# Patient Record
Sex: Female | Born: 1980 | Race: White | Hispanic: No | Marital: Single | Smoking: Former smoker
Health system: Southern US, Community
[De-identification: ages and names within clinical notes are randomized; demographics above are authoritative.]

## PROBLEM LIST (undated history)

## (undated) DIAGNOSIS — F32A Depression, unspecified: Secondary | ICD-10-CM

## (undated) DIAGNOSIS — K069 Disorder of gingiva and edentulous alveolar ridge, unspecified: Secondary | ICD-10-CM

## (undated) DIAGNOSIS — D649 Anemia, unspecified: Secondary | ICD-10-CM

## (undated) DIAGNOSIS — F909 Attention-deficit hyperactivity disorder, unspecified type: Secondary | ICD-10-CM

## (undated) DIAGNOSIS — F419 Anxiety disorder, unspecified: Secondary | ICD-10-CM

## (undated) DIAGNOSIS — G43909 Migraine, unspecified, not intractable, without status migrainosus: Secondary | ICD-10-CM

## (undated) DIAGNOSIS — R109 Unspecified abdominal pain: Secondary | ICD-10-CM

## (undated) DIAGNOSIS — F329 Major depressive disorder, single episode, unspecified: Secondary | ICD-10-CM

## (undated) DIAGNOSIS — K8020 Calculus of gallbladder without cholecystitis without obstruction: Secondary | ICD-10-CM

## (undated) HISTORY — DX: Unspecified abdominal pain: R10.9

## (undated) HISTORY — DX: Anemia, unspecified: D64.9

## (undated) HISTORY — DX: Disorder of gingiva and edentulous alveolar ridge, unspecified: K06.9

## (undated) HISTORY — DX: Depression, unspecified: F32.A

## (undated) HISTORY — DX: Major depressive disorder, single episode, unspecified: F32.9

## (undated) HISTORY — DX: Attention-deficit hyperactivity disorder, unspecified type: F90.9

## (undated) HISTORY — DX: Calculus of gallbladder without cholecystitis without obstruction: K80.20

## (undated) HISTORY — DX: Anxiety disorder, unspecified: F41.9

## (undated) HISTORY — DX: Migraine, unspecified, not intractable, without status migrainosus: G43.909

---

## 1999-05-20 ENCOUNTER — Encounter: Payer: Self-pay | Admitting: *Deleted

## 1999-05-20 ENCOUNTER — Ambulatory Visit (HOSPITAL_COMMUNITY): Admission: RE | Admit: 1999-05-20 | Discharge: 1999-05-20 | Payer: Self-pay | Admitting: *Deleted

## 1999-07-25 ENCOUNTER — Inpatient Hospital Stay (HOSPITAL_COMMUNITY): Admission: AD | Admit: 1999-07-25 | Discharge: 1999-07-25 | Payer: Self-pay | Admitting: *Deleted

## 1999-09-25 ENCOUNTER — Inpatient Hospital Stay (HOSPITAL_COMMUNITY): Admission: AD | Admit: 1999-09-25 | Discharge: 1999-09-29 | Payer: Self-pay | Admitting: *Deleted

## 1999-09-25 ENCOUNTER — Encounter (INDEPENDENT_AMBULATORY_CARE_PROVIDER_SITE_OTHER): Payer: Self-pay

## 1999-12-23 ENCOUNTER — Inpatient Hospital Stay (HOSPITAL_COMMUNITY): Admission: AD | Admit: 1999-12-23 | Discharge: 1999-12-23 | Payer: Self-pay | Admitting: *Deleted

## 2000-03-16 ENCOUNTER — Inpatient Hospital Stay (HOSPITAL_COMMUNITY): Admission: AD | Admit: 2000-03-16 | Discharge: 2000-03-16 | Payer: Self-pay | Admitting: *Deleted

## 2000-06-09 ENCOUNTER — Inpatient Hospital Stay (HOSPITAL_COMMUNITY): Admission: AD | Admit: 2000-06-09 | Discharge: 2000-06-09 | Payer: Self-pay | Admitting: *Deleted

## 2000-07-07 ENCOUNTER — Emergency Department (HOSPITAL_COMMUNITY): Admission: EM | Admit: 2000-07-07 | Discharge: 2000-07-07 | Payer: Self-pay | Admitting: Emergency Medicine

## 2000-07-07 ENCOUNTER — Encounter: Payer: Self-pay | Admitting: Emergency Medicine

## 2000-09-02 ENCOUNTER — Inpatient Hospital Stay (HOSPITAL_COMMUNITY): Admission: AD | Admit: 2000-09-02 | Discharge: 2000-09-02 | Payer: Self-pay | Admitting: *Deleted

## 2000-11-04 ENCOUNTER — Emergency Department (HOSPITAL_COMMUNITY): Admission: EM | Admit: 2000-11-04 | Discharge: 2000-11-04 | Payer: Self-pay | Admitting: Emergency Medicine

## 2000-11-05 ENCOUNTER — Emergency Department (HOSPITAL_COMMUNITY): Admission: EM | Admit: 2000-11-05 | Discharge: 2000-11-06 | Payer: Self-pay | Admitting: Emergency Medicine

## 2000-11-06 ENCOUNTER — Encounter: Payer: Self-pay | Admitting: Emergency Medicine

## 2000-11-15 ENCOUNTER — Emergency Department (HOSPITAL_COMMUNITY): Admission: EM | Admit: 2000-11-15 | Discharge: 2000-11-15 | Payer: Self-pay | Admitting: Emergency Medicine

## 2001-01-15 ENCOUNTER — Emergency Department (HOSPITAL_COMMUNITY): Admission: EM | Admit: 2001-01-15 | Discharge: 2001-01-15 | Payer: Self-pay | Admitting: Emergency Medicine

## 2001-01-15 ENCOUNTER — Encounter: Payer: Self-pay | Admitting: Emergency Medicine

## 2001-01-19 ENCOUNTER — Emergency Department (HOSPITAL_COMMUNITY): Admission: EM | Admit: 2001-01-19 | Discharge: 2001-01-19 | Payer: Self-pay

## 2001-03-09 ENCOUNTER — Other Ambulatory Visit: Admission: RE | Admit: 2001-03-09 | Discharge: 2001-03-09 | Payer: Self-pay | Admitting: Family Medicine

## 2001-05-12 ENCOUNTER — Emergency Department (HOSPITAL_COMMUNITY): Admission: EM | Admit: 2001-05-12 | Discharge: 2001-05-12 | Payer: Self-pay | Admitting: Emergency Medicine

## 2001-06-03 ENCOUNTER — Inpatient Hospital Stay (HOSPITAL_COMMUNITY): Admission: EM | Admit: 2001-06-03 | Discharge: 2001-06-10 | Payer: Self-pay | Admitting: Psychiatry

## 2001-06-15 ENCOUNTER — Encounter: Admission: RE | Admit: 2001-06-15 | Discharge: 2001-06-15 | Payer: Self-pay | Admitting: *Deleted

## 2001-06-26 ENCOUNTER — Inpatient Hospital Stay (HOSPITAL_COMMUNITY): Admission: EM | Admit: 2001-06-26 | Discharge: 2001-07-06 | Payer: Self-pay | Admitting: Psychiatry

## 2001-07-21 ENCOUNTER — Inpatient Hospital Stay (HOSPITAL_COMMUNITY): Admission: EM | Admit: 2001-07-21 | Discharge: 2001-07-23 | Payer: Self-pay

## 2001-07-23 ENCOUNTER — Inpatient Hospital Stay (HOSPITAL_COMMUNITY): Admission: AD | Admit: 2001-07-23 | Discharge: 2001-07-26 | Payer: Self-pay | Admitting: Psychiatry

## 2001-08-23 ENCOUNTER — Encounter: Admission: RE | Admit: 2001-08-23 | Discharge: 2001-08-23 | Payer: Self-pay | Admitting: *Deleted

## 2003-01-22 ENCOUNTER — Encounter: Admission: RE | Admit: 2003-01-22 | Discharge: 2003-01-22 | Payer: Self-pay | Admitting: Internal Medicine

## 2003-06-04 ENCOUNTER — Encounter: Admission: RE | Admit: 2003-06-04 | Discharge: 2003-06-04 | Payer: Self-pay | Admitting: Internal Medicine

## 2003-07-16 ENCOUNTER — Inpatient Hospital Stay (HOSPITAL_COMMUNITY): Admission: AD | Admit: 2003-07-16 | Discharge: 2003-07-16 | Payer: Self-pay | Admitting: Obstetrics & Gynecology

## 2003-08-13 ENCOUNTER — Other Ambulatory Visit: Admission: RE | Admit: 2003-08-13 | Discharge: 2003-08-13 | Payer: Self-pay | Admitting: Obstetrics and Gynecology

## 2004-02-22 ENCOUNTER — Encounter (INDEPENDENT_AMBULATORY_CARE_PROVIDER_SITE_OTHER): Payer: Self-pay | Admitting: *Deleted

## 2004-02-22 ENCOUNTER — Inpatient Hospital Stay (HOSPITAL_COMMUNITY): Admission: RE | Admit: 2004-02-22 | Discharge: 2004-02-26 | Payer: Self-pay | Admitting: Obstetrics and Gynecology

## 2005-07-04 ENCOUNTER — Emergency Department (HOSPITAL_COMMUNITY): Admission: EM | Admit: 2005-07-04 | Discharge: 2005-07-05 | Payer: Self-pay | Admitting: Emergency Medicine

## 2005-07-04 ENCOUNTER — Emergency Department (HOSPITAL_COMMUNITY): Admission: EM | Admit: 2005-07-04 | Discharge: 2005-07-04 | Payer: Self-pay | Admitting: Emergency Medicine

## 2005-11-20 ENCOUNTER — Emergency Department (HOSPITAL_COMMUNITY): Admission: EM | Admit: 2005-11-20 | Discharge: 2005-11-20 | Payer: Self-pay | Admitting: Emergency Medicine

## 2006-06-09 ENCOUNTER — Emergency Department (HOSPITAL_COMMUNITY): Admission: EM | Admit: 2006-06-09 | Discharge: 2006-06-09 | Payer: Self-pay | Admitting: Emergency Medicine

## 2006-06-30 ENCOUNTER — Emergency Department (HOSPITAL_COMMUNITY): Admission: EM | Admit: 2006-06-30 | Discharge: 2006-06-30 | Payer: Self-pay | Admitting: Emergency Medicine

## 2006-07-01 ENCOUNTER — Emergency Department (HOSPITAL_COMMUNITY): Admission: EM | Admit: 2006-07-01 | Discharge: 2006-07-01 | Payer: Self-pay | Admitting: Emergency Medicine

## 2007-03-15 ENCOUNTER — Emergency Department (HOSPITAL_COMMUNITY): Admission: EM | Admit: 2007-03-15 | Discharge: 2007-03-15 | Payer: Self-pay | Admitting: Emergency Medicine

## 2008-09-03 ENCOUNTER — Emergency Department (HOSPITAL_COMMUNITY): Admission: EM | Admit: 2008-09-03 | Discharge: 2008-09-03 | Payer: Self-pay | Admitting: Family Medicine

## 2009-03-04 ENCOUNTER — Emergency Department (HOSPITAL_COMMUNITY): Admission: EM | Admit: 2009-03-04 | Discharge: 2009-03-04 | Payer: Self-pay | Admitting: Emergency Medicine

## 2010-02-25 ENCOUNTER — Ambulatory Visit: Payer: Self-pay | Admitting: Family Medicine

## 2010-02-25 DIAGNOSIS — F329 Major depressive disorder, single episode, unspecified: Secondary | ICD-10-CM | POA: Insufficient documentation

## 2010-02-25 DIAGNOSIS — D509 Iron deficiency anemia, unspecified: Secondary | ICD-10-CM

## 2010-02-26 LAB — CONVERTED CEMR LAB
CO2: 29 meq/L (ref 19–32)
Cholesterol: 177 mg/dL (ref 0–200)
Eosinophils Absolute: 0.2 10*3/uL (ref 0.0–0.7)
Eosinophils Relative: 2.4 % (ref 0.0–5.0)
GFR calc non Af Amer: 97.09 mL/min (ref >60)
Glucose, Bld: 90 mg/dL (ref 70–99)
LDL Cholesterol: 120 mg/dL — ABNORMAL HIGH (ref 0–99)
Lymphocytes Relative: 25.1 % (ref 12.0–46.0)
MCV: 74.5 fL — ABNORMAL LOW (ref 78.0–100.0)
Monocytes Absolute: 0.4 10*3/uL (ref 0.1–1.0)
Monocytes Relative: 5.4 % (ref 3.0–12.0)
Neutro Abs: 5.5 10*3/uL (ref 1.4–7.7)
Neutrophils Relative %: 66.9 % (ref 43.0–77.0)
Platelets: 377 10*3/uL (ref 150.0–400.0)
Potassium: 5.1 meq/L (ref 3.5–5.1)
Sodium: 140 meq/L (ref 135–145)
Total CHOL/HDL Ratio: 5
VLDL: 19.4 mg/dL (ref 0.0–40.0)
WBC: 8.2 10*3/uL (ref 4.5–10.5)

## 2010-03-28 ENCOUNTER — Ambulatory Visit: Payer: Self-pay | Admitting: Family Medicine

## 2010-03-28 DIAGNOSIS — H109 Unspecified conjunctivitis: Secondary | ICD-10-CM | POA: Insufficient documentation

## 2010-03-28 DIAGNOSIS — F411 Generalized anxiety disorder: Secondary | ICD-10-CM

## 2010-04-04 ENCOUNTER — Ambulatory Visit: Payer: Self-pay | Admitting: Internal Medicine

## 2010-04-04 LAB — CONVERTED CEMR LAB: Rapid Strep: NEGATIVE

## 2010-04-16 ENCOUNTER — Ambulatory Visit: Payer: Self-pay | Admitting: Family Medicine

## 2010-05-15 ENCOUNTER — Telehealth: Payer: Self-pay | Admitting: Family Medicine

## 2010-05-27 NOTE — Assessment & Plan Note (Signed)
Summary: BRAND NEW PT/TO EST/PT REQ CPX/COMING IN FASTING/PER DR Antoneo Ghrist/CJR   Vital Signs:  Patient profile:   30 year old female Height:      67.5 inches Weight:      206 pounds BMI:     31.90 O2 Sat:      98 % Temp:     98.5 degrees F Pulse rate:   103 / minute BP sitting:   120 / 82  (left arm) Cuff size:   large  Vitals Entered By: Pura Spice, RN (February 25, 2010 8:31 AM) CC: new to est. wants cpx fasting. under alot stress. parents nephews now living with her and her family PAP sch for Mar 10 2010 w/ Dr Jackelyn Knife    History of Present Illness: 30 yr old female to establish with [place] and for a cpx. She feels fine physically but is under a lot of stress right now. Her parents had to leave their apartment 3 weeks ago because they could not keep up with their rent payments, so they moved in with the patient. Now her house is full of people, and she is very stressed out. She can't relax, she is tearful, she has little patience with everyone, etc. She took Zoloft and Xanax for awhile about 10 years ago when she dealt with depression, and they helped a lot.   Preventive Screening-Counseling & Management  Alcohol-Tobacco     Smoking Status: current     Smoking Cessation Counseling: YES     Packs/Day: 0.5     Year Started: 1995  Allergies (verified): 1)  ! Pcn 2)  ! Sulfa  Past History:  Past Medical History: Anemia-iron deficiency Depression migraine Headaches treated for ADHD as a child sees Dr. Jarold Song for GYN exams  Past Surgical History: Caesarean sections times 2  Past History:  Care Management: Gynecology: Dr Jackelyn Knife   Family History: Reviewed history and no changes required. Family History Breast cancer 1st degree relative <50 Family History Depression  Social History: Reviewed history and no changes required. Occupation: Careers adviser at [employer] Current Smoker 1/2 ppd Alcohol use-yes engaged, lives with her fiance  Smoking Status:   current Packs/Day:  0.5 Occupation:  employed  Review of Systems  The patient denies anorexia, fever, weight loss, weight gain, vision loss, decreased hearing, hoarseness, chest pain, syncope, dyspnea on exertion, peripheral edema, prolonged cough, headaches, hemoptysis, abdominal pain, melena, hematochezia, severe indigestion/heartburn, hematuria, incontinence, genital sores, muscle weakness, suspicious skin lesions, transient blindness, difficulty walking, unusual weight change, abnormal bleeding, enlarged lymph nodes, angioedema, breast masses, and testicular masses.    Physical Exam  General:  overweight-appearing.   Head:  Normocephalic and atraumatic without obvious abnormalities. No apparent alopecia or balding. Eyes:  No corneal or conjunctival inflammation noted. EOMI. Perrla. Funduscopic exam benign, without hemorrhages, exudates or papilledema. Vision grossly normal. Ears:  External ear exam shows no significant lesions or deformities.  Otoscopic examination reveals clear canals, tympanic membranes are intact bilaterally without bulging, retraction, inflammation or discharge. Hearing is grossly normal bilaterally. Nose:  External nasal examination shows no deformity or inflammation. Nasal mucosa are pink and moist without lesions or exudates. Mouth:  Oral mucosa and oropharynx without lesions or exudates.  Teeth in good repair. Neck:  No deformities, masses, or tenderness noted. Chest Wall:  No deformities, masses, or tenderness noted. Lungs:  Normal respiratory effort, chest expands symmetrically. Lungs are clear to auscultation, no crackles or wheezes. Heart:  Normal rate and regular rhythm. S1 and S2  normal without gallop, murmur, click, rub or other extra sounds. Abdomen:  Bowel sounds positive,abdomen soft and non-tender without masses, organomegaly or hernias noted. Msk:  No deformity or scoliosis noted of thoracic or lumbar spine.   Pulses:  R and L  carotid,radial,femoral,dorsalis pedis and posterior tibial pulses are full and equal bilaterally Extremities:  No clubbing, cyanosis, edema, or deformity noted with normal full range of motion of all joints.   Neurologic:  No cranial nerve deficits noted. Station and gait are normal. Plantar reflexes are down-going bilaterally. DTRs are symmetrical throughout. Sensory, motor and coordinative functions appear intact. Skin:  Intact without suspicious lesions or rashes Cervical Nodes:  No lymphadenopathy noted Axillary Nodes:  No palpable lymphadenopathy Inguinal Nodes:  No significant adenopathy Psych:  Cognition and judgment appear intact. Alert and cooperative with normal attention span and concentration. No apparent delusions, illusions, hallucinations   Impression & Recommendations:  Problem # 1:  HEALTH MAINTENANCE EXAM (ICD-V70.0)  Orders: UA Dipstick w/o Micro (automated)  (81003) Venipuncture (83151) TLB-Lipid Panel (80061-LIPID) TLB-BMP (Basic Metabolic Panel-BMET) (80048-METABOL) TLB-CBC Platelet - w/Differential (85025-CBCD) TLB-Hepatic/Liver Function Pnl (80076-HEPATIC) TLB-TSH (Thyroid Stimulating Hormone) (84443-TSH)  Problem # 2:  DEPRESSION (ICD-311)  Her updated medication list for this problem includes:    Celexa 20 Mg Tabs (Citalopram hydrobromide) ..... Once daily    Alprazolam 0.5 Mg Tabs (Alprazolam) .Marland Kitchen..Marland Kitchen Two times a day as needed anxiety  Complete Medication List: 1)  Celexa 20 Mg Tabs (Citalopram hydrobromide) .... Once daily 2)  Alprazolam 0.5 Mg Tabs (Alprazolam) .... Two times a day as needed anxiety  Patient Instructions: 1)  Tobacco is very bad for your health and your loved ones ! You should stop smoking !  2)  It is important that you exercise reguarly at least 20 minutes 5 times a week. If you develop chest pain, have severe difficulty breathing, or feel very tired, stop exercising immediately and seek medical attention.  3)  You need to lose weight.  Consider a lower calorie diet and regular exercise.  4)  get fasting labs today 5)  Please schedule a follow-up appointment in 1 month.  Prescriptions: ALPRAZOLAM 0.5 MG TABS (ALPRAZOLAM) two times a day as needed anxiety  #60 x 2   Entered and Authorized by:   Nelwyn Salisbury MD   Signed by:   Nelwyn Salisbury MD on 02/25/2010   Method used:   Print then Give to Patient   RxID:   [id number] CELEXA 20 MG TABS (CITALOPRAM HYDROBROMIDE) once daily  #30 x 2   Entered and Authorized by:   Nelwyn Salisbury MD   Signed by:   Nelwyn Salisbury MD on 02/25/2010   Method used:   Print then Give to Patient   RxID:   [id number]    Orders Added: 1)  New Patient 18-39 years [99385] 2)  UA Dipstick w/o Micro (automated)  [81003] 3)  Venipuncture [36415] 4)  TLB-Lipid Panel [80061-LIPID] 5)  TLB-BMP (Basic Metabolic Panel-BMET) [80048-METABOL] 6)  TLB-CBC Platelet - w/Differential [85025-CBCD] 7)  TLB-Hepatic/Liver Function Pnl [80076-HEPATIC] 8)  TLB-TSH (Thyroid Stimulating Hormone) [99371-IRC]  Appended Document: Orders Update    Clinical Lists Changes  Orders: Added new Service order of Specimen Handling (78938) - Signed      Appended Document: BRAND NEW PT/TO EST/PT REQ CPX/COMING IN FASTING/PER DR Patricia Perales/CJR  Laboratory Results   Urine Tests    Routine Urinalysis   Color: yellow Appearance: Clear Glucose: negative   (Normal Range: Negative) Bilirubin:  negative   (Normal Range: Negative) Ketone: negative   (Normal Range: Negative) Spec. Gravity: 1.025   (Normal Range: 1.003-1.035) Blood: 2+   (Normal Range: Negative) pH: 5.5   (Normal Range: 5.0-8.0) Protein: negative   (Normal Range: Negative) Urobilinogen: 0.2   (Normal Range: 0-1) Nitrite: negative   (Normal Range: Negative) Leukocyte Esterace: negative   (Normal Range: Negative)    Comments: Rita Ohara  February 25, 2010 11:12 AM      Appended Document: BRAND NEW PT/TO EST/PT REQ CPX/COMING IN  FASTING/PER DR Illyria Sobocinski/CJR her urine had some blood in it. was she on her menses?   Appended Document: BRAND NEW PT/TO EST/PT REQ CPX/COMING IN FASTING/PER DR Perl Folmar/CJR yes ,

## 2010-05-27 NOTE — Assessment & Plan Note (Signed)
Summary: ?conjunctivitis in eye/cjr   Vital Signs:  Patient profile:   30 year old female Weight:      208 pounds O2 Sat:      94 % Temp:     98.7 degrees F Pulse rate:   96 / minute BP sitting:   130 / 84  (left arm) Cuff size:   regular  Vitals Entered By: Pura Spice, RN (March 28, 2010 3:05 PM) CC: rt eye conjunctivitis    History of Present Illness: Here for 2 days of redness, burning, and yellow crusting in both eyes. She had some URI symptoms last week with stuffy head and ST, but these have resolved. No fever or cough. Using warm compresses.   Allergies: 1)  ! Pcn 2)  ! Sulfa  Past History:  Past Medical History: Anemia-iron deficiency Depression migraine Headaches treated for ADHD as a child sees Dr. Jarold Song for GYN exams Anxiety  Review of Systems  The patient denies anorexia, fever, weight loss, weight gain, vision loss, decreased hearing, hoarseness, chest pain, syncope, dyspnea on exertion, peripheral edema, prolonged cough, headaches, hemoptysis, abdominal pain, melena, hematochezia, severe indigestion/heartburn, hematuria, incontinence, genital sores, muscle weakness, suspicious skin lesions, transient blindness, difficulty walking, depression, unusual weight change, abnormal bleeding, enlarged lymph nodes, angioedema, breast masses, and testicular masses.    Physical Exam  General:  Well-developed,well-nourished,in no acute distress; alert,appropriate and cooperative throughout examination Head:  Normocephalic and atraumatic without obvious abnormalities. No apparent alopecia or balding. Eyes:  both conjunctivae are red, the right worse than the left. The right upper and lower lids are a bit swollen. Corneas are clear. vision grossly intact, pupils equal, pupils round, pupils reactive to light, and pupils react to accomodation.   Ears:  External ear exam shows no significant lesions or deformities.  Otoscopic examination reveals clear canals,  tympanic membranes are intact bilaterally without bulging, retraction, inflammation or discharge. Hearing is grossly normal bilaterally. Nose:  External nasal examination shows no deformity or inflammation. Nasal mucosa are pink and moist without lesions or exudates. Mouth:  Oral mucosa and oropharynx without lesions or exudates.  Teeth in good repair. Neck:  No deformities, masses, or tenderness noted. Lungs:  Normal respiratory effort, chest expands symmetrically. Lungs are clear to auscultation, no crackles or wheezes.   Impression & Recommendations:  Problem # 1:  CONJUNCTIVITIS (ICD-372.30)  Problem # 2:  VIRAL URI (ICD-465.9)  Complete Medication List: 1)  Celexa 20 Mg Tabs (Citalopram hydrobromide) .... Once daily 2)  Alprazolam 0.5 Mg Tabs (Alprazolam) .... Two times a day as needed anxiety 3)  Cortisporin 3.5-10000-1 Soln (Neomycin-polymyxin-hc) .... 2 drops in eyes q 4 hours as needed  Patient Instructions: 1)  Please schedule a follow-up appointment as needed .  Prescriptions: CORTISPORIN 3.5-10000-1 SOLN (NEOMYCIN-POLYMYXIN-HC) 2 drops in eyes q 4 hours as needed  #10 x 0   Entered and Authorized by:   Nelwyn Salisbury MD   Signed by:   Nelwyn Salisbury MD on 03/28/2010   Method used:   Electronically to        [facility]  [phone]* (retail)       [address] [place], [place]  [address]       Ph: [phone] or [phone]       Fax: [phone]   RxID:   [id number]    Orders Added: 1)  Est. Patient Level IV [51884]

## 2010-05-27 NOTE — Assessment & Plan Note (Signed)
Summary: fever/st/njr   Vital Signs:  Patient profile:   30 year old female Weight:      207 pounds Temp:     98.1 degrees F oral BP sitting:   118 / 74  (left arm) Cuff size:   regular  Vitals Entered By: Duard Brady LPN (April 04, 2010 4:13 PM) CC: c/o sore throat Is Patient Diabetic? No   CC:  c/o sore throat.  History of Present Illness: 30 year old patient, who presents with a one-day history of fever and severe sore throat.  Her son was diagnosed with strep pharyngitis.  Yesterday, and is on antibiotic therapy.  The patient denies any headache or any URI symptoms.  There has been no hoarseness, cough, congestion or rhinorrhea.  Fever earlier today at 101 degrees  Allergies: 1)  ! Pcn 2)  ! Sulfa  Past History:  Past Medical History: Reviewed history from 03/28/2010 and no changes required. Anemia-iron deficiency Depression migraine Headaches treated for ADHD as a child sees Dr. Jarold Song for GYN exams Anxiety  Review of Systems       The patient complains of anorexia and fever.  The patient denies weight loss, weight gain, vision loss, decreased hearing, hoarseness, chest pain, syncope, dyspnea on exertion, peripheral edema, prolonged cough, headaches, hemoptysis, abdominal pain, melena, hematochezia, severe indigestion/heartburn, hematuria, incontinence, genital sores, muscle weakness, suspicious skin lesions, transient blindness, difficulty walking, depression, unusual weight change, abnormal bleeding, enlarged lymph nodes, angioedema, and breast masses.    Physical Exam  General:  Well-developed,well-nourished,in no acute distress; alert,appropriate and cooperative throughout examination Head:  Normocephalic and atraumatic without obvious abnormalities. No apparent alopecia or balding. Eyes:  No corneal or conjunctival inflammation noted. EOMI. Perrla. Funduscopic exam benign, without hemorrhages, exudates or papilledema. Vision grossly normal. Ears:   External ear exam shows no significant lesions or deformities.  Otoscopic examination reveals clear canals, tympanic membranes are intact bilaterally without bulging, retraction, inflammation or discharge. Hearing is grossly normal bilaterally. Mouth:  pharyngeal erythema.  small pustule, involving the left posterior pharyngeal arch Neck:  No deformities, masses, or tenderness noted. Lungs:  Normal respiratory effort, chest expands symmetrically. Lungs are clear to auscultation, no crackles or wheezes. Heart:  Normal rate and regular rhythm. S1 and S2 normal without gallop, murmur, click, rub or other extra sounds.   Impression & Recommendations:  Problem # 1:  SORE THROAT (ICD-462)  Her updated medication list for this problem includes:    Amoxicillin 500 Mg Caps (Amoxicillin) ..... One three times a day with meals  Orders: Rapid Strep (16606) patient has a pure sore throat, and fever and a strong exposure history.  In spite of the negative rapid strep will treat for presumptive streptococcal pharyngitis.  Due to lack of typical URI symptoms  Complete Medication List: 1)  Celexa 20 Mg Tabs (Citalopram hydrobromide) .... Once daily 2)  Alprazolam 0.5 Mg Tabs (Alprazolam) .... Two times a day as needed anxiety 3)  Cortisporin 3.5-10000-1 Soln (Neomycin-polymyxin-hc) .... 2 drops in eyes q 4 hours as needed 4)  Amoxicillin 500 Mg Caps (Amoxicillin) .... One three times a day with meals 5)  Hydrocodone-acetaminophen 5-500 Mg Tabs (Hydrocodone-acetaminophen) .... One every 6 hours for pain  Patient Instructions: 1)  Please schedule a follow-up appointment as needed. 2)  Take 400-600mg  of Ibuprofen (Advil, Motrin) with food every 4-6 hours as needed for relief of pain or comfort of fever. 3)  Take your antibiotic as prescribed until ALL of it is gone, but stop if  you develop a rash or swelling and contact our office as soon as possible. Prescriptions: AMOXICILLIN 500 MG CAPS (AMOXICILLIN)  one three times a day with meals  #30 x 0   Entered and Authorized by:   Gordy Savers  MD   Signed by:   Gordy Savers  MD on 04/04/2010   Method used:   Print then Give to Patient   RxID:   [id number] HYDROCODONE-ACETAMINOPHEN 5-500 MG TABS (HYDROCODONE-ACETAMINOPHEN) one every 6 hours for pain  #30 x 0   Entered and Authorized by:   Gordy Savers  MD   Signed by:   Gordy Savers  MD on 04/04/2010   Method used:   Print then Give to Patient   RxID:   [id number] AMOXICILLIN 500 MG CAPS (AMOXICILLIN) one three times a day with meals  #30 x 0   Entered and Authorized by:   Gordy Savers  MD   Signed by:   Gordy Savers  MD on 04/04/2010   Method used:   Electronically to        [facility]  [phone]* (retail)       [address] [place], [place]  [address]       Ph: [phone] or [phone]       Fax: [phone]   RxID:   [id number]    Orders Added: 1)  Rapid Strep [01093] 2)  Est. Patient Level III [23557]    Laboratory Results  Date/Time Received: April 04, 2010 4:29 PM  Date/Time Reported: April 04, 2010 4:29 PM   Other Tests  Rapid Strep: negative

## 2010-05-29 NOTE — Progress Notes (Signed)
Summary: Pt says her meds need to be adjusted. Not effective anymore.  Phone Note Call from Patient Call back at [facility] Phone [phone]   Caller: Patient Summary of Call: Pt called and said that she has having problems with med. The med for pts nerves and also med for anxiety are not working as well as they used to when they were first prescribed. Pt says that dosage amount needs to be adusted on both meds.  Initial call taken by: Lucy Antigua,  May 15, 2010 4:20 PM  Follow-up for Phone Call        Increase Celexa to 40 mg a day, call in #60 with 5rf. also increase Xanax to 1 mg two times a day as needed , #60 with 5 rf Follow-up by: Nelwyn Salisbury MD,  May 16, 2010 2:28 PM  Additional Follow-up for Phone Call Additional follow up Details #1::        spoke with pt  verbalized understanding  [facility] Additional Follow-up by: Pura Spice, RN,  May 16, 2010 2:31 PM    New/Updated Medications: CELEXA 40 MG TABS (CITALOPRAM HYDROBROMIDE) 1 by mouth once daily ALPRAZOLAM 1 MG TABS (ALPRAZOLAM) 1 by mouth two times a day as needed Prescriptions: ALPRAZOLAM 1 MG TABS (ALPRAZOLAM) 1 by mouth two times a day as needed  #60 x 5   Entered by:   Pura Spice, RN   Authorized by:   Nelwyn Salisbury MD   Signed by:   Pura Spice, RN on 05/16/2010   Method used:   Telephoned to ...       [facility]  [phone]* (retail)       [address] [place], [place]  [address]       Ph: [phone] or [phone]       Fax: [phone]   RxID:   [id number] CELEXA 40 MG TABS (CITALOPRAM HYDROBROMIDE) 1 by mouth once daily  #60 x 5   Entered by:   Pura Spice, RN   Authorized by:   Nelwyn Salisbury MD   Signed by:   Pura Spice, RN on 05/16/2010   Method used:   Electronically to        [facility]  [phone]* (retail)       [address] [place], [place]  [address]       Ph: [phone] or [phone]       Fax: [phone]  RxID:   [id number]

## 2010-08-05 LAB — POCT URINALYSIS DIP (DEVICE)
Glucose, UA: NEGATIVE mg/dL
Ketones, ur: NEGATIVE mg/dL
Specific Gravity, Urine: <=1.005 (ref 1.005–1.030)
Urobilinogen, UA: 0.2 mg/dL (ref 0.0–1.0)

## 2010-08-05 LAB — POCT PREGNANCY, URINE: Preg Test, Ur: NEGATIVE

## 2010-08-05 LAB — URINE CULTURE

## 2010-09-12 NOTE — Discharge Summary (Signed)
[facility]  Patient:    Sheila [name], Sheila [name] Visit Number: [id number] MRN: [id number]          Service Type: PSY Attending Physician:  Jeanice Lim Dictated by:   Jeanice Lim, M.D. Admit Date:  07/23/2001 Discharge Date: 07/26/2001                             Discharge Summary  IDENTIFYING DATA:  This is a 30 year old Caucasian female, single mother, admitted at [facility] for a serious overdose on Restoril.  The patient reported impulsively taking an overdose.  MEDICATIONS:  Prior to hospitalization were Risperdal, Paxil and BuSpar.  ALLERGIES:  No known drug allergies.  PHYSICAL EXAMINATION:  Essentially within normal limits.  Neurologically nonfocal.  LABORATORY DATA:  Routine admission labs within normal limits.  MENTAL STATUS EXAMINATION:  Drowsy white female apparently sick and tired. Mood depressed.  Affect restricted.  Remorseful about suicide attempt.  Speech slow, psychomotor retarded.  Thought process goal directed.  Thought content negative for psychotic symptoms or dangerous ideation.  Cognitively intact. Judgment and insight improving.  ADMISSION DIAGNOSES: Axis I:    Major depression, recurrent, severe. Axis II:   Personality disorder not otherwise specified. Axis III:  Status post overdose on Restoril. Axis IV:   Moderate (stressors related to primary support system, occupation            and economics). Axis V:    30/65.  HOSPITAL COURSE:  The patient was admitted and ordered routine p.r.n. medications.  Restarted on trazodone, Paxil and Risperdal as well as BuSpar. Paxil was optimized and patient tolerated medication changes well without significant side effects, reporting positive response.  Family was contacted and psychosocial stressors were addressed.  The patient reported feeling better with clinical intervention.  CONDITION ON DISCHARGE:  Improved.  Mood euthymic.  Affect bright.  Thought process goal  directed.  Thought content negative for dangerous ideation or psychotic symptoms.  DISCHARGE MEDICATIONS: 1. Trazodone 50 mg, 1-1/2 q.h.s. 2. Risperdal 0.25 mg q.a.m., 3 p.m. and 2 q.h.s. 3. BuSpar 10 mg t.i.d. 4. Paxil CR 25 mg, 2 at 6 p.m.  FOLLOW-UP:  Netta Cedars, M.D. on August 23, 2001 at 2 p.m.  DISCHARGE DIAGNOSES: Axis I:    Major depression, recurrent, severe. Axis II:   Personality disorder not otherwise specified. Axis III:  Status post overdose on Restoril. Axis IV:   Moderate (stressors related to primary support system, occupation            and economics). Axis V:    Global Assessment of Functioning on discharge 55. Dictated by:   Jeanice Lim, M.D. Attending Physician:  Jeanice Lim DD:  09/07/01 TD:  09/08/01 Job: [id number]
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
## 2010-09-12 NOTE — Op Note (Signed)
NAMECALIANNE, LARUE                ACCOUNT NO.:  [id number]   MEDICAL RECORD NO.:  [id number]          PATIENT TYPE:  INP   LOCATION:  9102                          [facility]ACILITY:  [facility]   PHYSICIAN:  Leighton Roach Meisinger, M.D.DATE OF BIRTH:  [birth year]   DATE OF PROCEDURE:  02/22/2004  DATE OF DISCHARGE:                                 OPERATIVE REPORT   PREOPERATIVE DIAGNOSES:  Intrauterine pregnancy at 39 weeks, previous  cesarean section and desires surgical sterility.   POSTOPERATIVE DIAGNOSES:  Intrauterine pregnancy at 39 weeks, previous  cesarean section and desires surgical sterility.   PROCEDURE:  Repeat low transverse cesarean section and bilateral partial  salpingectomy.   SURGEON:  Zenaida Niece, M.D.   ASSISTANT:  Malachi Pro. Ambrose Mantle, M.D.   ANESTHESIA:  Spinal.   ESTIMATED BLOOD LOSS:  800 mL.   FINDINGS:  The patient had a normal anatomy and delivered a viable female  infant with Apgar's of 8 and 9 that weighed 6 pounds 5 ounces.   DESCRIPTION OF PROCEDURE:  The patient was taken to the operating room and  placed in the sitting position. Dr. Arby Barrette instilled spinal anesthesia and  she was placed in the dorsal supine position with a left lateral tilt.  Abdomen was prepped and draped in the usual sterile fashion and a Foley  catheter inserted. The level of her anesthesia was found to be adequate and  her abdomen was entered via her previous Pfannenstiel scar.  A bladder blade  was placed and a 4 cm transverse incision was made in the lower uterine  segment pushing the bladder inferior.  The lower uterine segment was  slightly thin.  Once the amniotic cavity was entered which revealed clear  fluid, the incision was extended bilaterally digitally.  The fetal vertex  was grasped and delivered through the incision atraumatically.  The mouth  and nares were suctioned. The remainder of the infant then delivered  atraumatically. The cord was doubly clamped and cut and the  infant handed to  the awaiting pediatric team. Cord blood was obtained and the placenta  delivered spontaneously. The uterus was wiped dry with a clean lap pad. The  uterine incision was inspected and found to be free of extensions. The  uterine incision was closed in one layer being a running locking layer with  #1 chromic with adequate hemostasis. Bleeding from serosal edges was  controlled with electrocautery.   Attention was turned to the tubal ligation.  Both fallopian tubes were  identified and traced to their fimbriated ends.  Ovaries were normal.  A  segment of each tube was grasped with a Babcock clamp and a window made with  electrocautery in an avascular portion of the mesosalpinx. The #0 plain gut  suture was passed through this window, tied proximally and wrapped around  the distal part of the tube to form a knuckle of tube.  The knuckle of tube  was then removed sharply. On both sides, both tubal ostia were identified  and the stumps were hemostatic.  The uterine incision was again inspected  and found to  be hemostatic. The subfascial space was then irrigated and  made hemostatic with electrocautery. The fascia was closed in a running  fashion starting at both ends and meeting in the  middle with #0 Vicryl.  The subcutaneous tissue was then irrigated and made  hemostatic with electrocautery. The skin was closed with staples and a  sterile dressing. The patient tolerated the procedure well and was taken to  the recovery room in stable condition. Counts were correct x2 and she was  given Ancef 1 g after cord clamp.     Todd   TDM/MEDQ  D:  02/22/2004  T:  02/22/2004  Job:  [id number]

## 2010-09-12 NOTE — Discharge Summary (Signed)
[facility]  Patient:    Sheila [name], Sheila [name] Visit Number: [id number] MRN: [id number]          Service Type: PSY Attending Physician:  Jeanice Lim Dictated by:   Jeanice Lim, M.D. Admit Date:  07/23/2001 Discharge Date: 07/26/2001                             Discharge Summary  IDENTIFYING DATA:  This is a 30 year old single Caucasian female voluntarily admitted for intentional overdose.  ADMISSION MEDICATIONS:  None.  ALLERGIES:  PENICILLIN, SULFA.  PHYSICAL EXAMINATION:  GENERAL:  Essentially within normal limits.  NEUROLOGIC:  Nonfocal.  ROUTINE ADMISSION LABORATORY DATA:  Within normal limits including urine drug screen which was negative.  MENTAL STATUS EXAMINATION:  Very sleepy, young Caucasian female.  Speech: Within normal limits.  Mood: Reported just being tired.  Affect: Somewhat restricted.  Thought process: Mostly goal directed.  Thought content: Negative for dangerous ideation or psychotic symptoms.  Cognitive: Intact.  ADMITTING DIAGNOSES: Axis I:    Depressive disorder, not otherwise specified. Axis II:   None. Axis III:  None. Axis IV:   Moderate problems with primary support group. Axis V:    35/60  HOSPITAL COURSE:  The patient was admitted and ordered routine p.r.n. medications.  Paxil CR 12.5 mg was started and titrated, targeting depressive symptoms and Seroquel to restore sleep.  The patient tolerated medication changes and eventually required Restoril to sleep and trazodone and Seroquel were discontinued.  CONDITION AT DISCHARGE:  Improved.  Mood: More stable and euthymic.  Affect: Brighter.  Thought process: Goal directed.  Thought content: Negative for dangerous ideation or psychotic symptoms.  DISCHARGE MEDICATIONS: 1. Paxil CR 25 mg one half q.a.m. 2. Restoril 15 mg three q.h.s.  FOLLOWUP:  Dr. Lourdes Sledge on February 19 at 1 p.m.  DISCHARGE DIAGNOSES: Axis I:    Depressive disorder, not otherwise  specified. Axis II:   None. Axis III:  None. Axis IV:   Moderate problems with primary support group. Axis V:    Global assessment of functioning on discharge was 55. Dictated by:   Jeanice Lim, M.D. Attending Physician:  Jeanice Lim DD:  08/10/01 TD:  08/12/01 Job: [id number]

## 2010-09-12 NOTE — Discharge Summary (Signed)
[name], [name]                ACCOUNT NO.:  [id number]   MEDICAL RECORD NO.:  [id number]          PATIENT TYPE:  INP   LOCATION:  9102                          [facility]ACILITY:  [facility]   PHYSICIAN:  Zenaida Niece, M.D.DATE OF BIRTH:  Dec 03, 1980   DATE OF ADMISSION:  02/22/2004  DATE OF DISCHARGE:                                 DISCHARGE SUMMARY   ADMISSION DIAGNOSES:  1.  Intrauterine pregnancy at 39 weeks.  2.  Previous cesarean section.  3.  Desires surgical sterility.   DISCHARGE DIAGNOSES:  1.  Intrauterine pregnancy at 39 weeks.  2.  Previous cesarean section.  3.  Desires surgical sterility.   PROCEDURES:  On February 22, 2004 she had a repeat low transverse cesarean  section and bilateral partial salpingectomy.   HISTORY AND PHYSICAL:  Please see chart for full history and physical, but  briefly this is a 30 year old white female gravida 3 para 1-0-1-1 with an  EGA of [redacted] weeks who presents for repeat cesarean section.  The patient has  one prior cesarean section and is cleared for a trial of labor but declines  this.  Prenatal care is, again, in the History and Physical.  Past history  significant for one cesarean section and a history of depression, and  allergies to PENICILLIN and SULFA.  Physical exam significant for weight of  167 pounds and a benign abdomen with a fundal height of 35 cm and a well-  healed transverse scar.  Cervix was closed, thick, -2, and vertex.   HOSPITAL COURSE:  The patient was admitted on the day of surgery and  underwent repeat cesarean section and tubal ligation under spinal anesthesia  with an estimated blood loss of 800 mL.  The patient had normal anatomy and  delivered a viable female infant with Apgars of 8 and 9 that weighed 6 pounds  5 ounces.  Postoperatively, she had no significant complications.  Predelivery hemoglobin 10.8, postdelivery 9.3.  On postoperative day #4, the  patient was stable for discharge home.  At that time her  incision was  healing well and staples were removed and Steri-Strips applied.   DISCHARGE INSTRUCTIONS:  Regular diet, pelvic rest, no strenuous activity.  Follow-up is in two weeks for an incision check.  Medications are Percocet  #40 one to two p.o. q.4-6h. p.r.n. pain and over-the-counter ibuprofen as  needed.  She is given our discharge pamphlet.     Todd   TDM/MEDQ  D:  02/26/2004  T:  02/26/2004  Job:  [id number]

## 2010-09-12 NOTE — H&P (Signed)
[facility]  Patient:    Sheila [name], Sheila [name] Visit Number: [id number] MRN: [id number]          Service Type: PSY Location: 500 0630 02 Attending Physician:  Rachael Fee Dictated by:   Candi Leash. Orsini, N.P. Admit Date:  06/26/2001                     Psychiatric Admission Assessment  IDENTIFYING INFORMATION:  This is a 30 year old separated white female voluntarily admitted on June 26, 2001 for depression and suicidal ideation.  HISTORY OF PRESENT ILLNESS:  The patient presents with a history of depression.  Was recently discharged from [facility] on May 30, 2001.  Did well for approximately two weeks, then had a problem with her family and her boyfriend.  The patient was having suicidal thoughts with a plan to overdose but states she would not do anything like this because of her child.  She has been sleeping only fair.  Appetite has been decreased. Reports a few-pound weight loss.  She denies any psychosis and promises safety at this time.  PAST PSYCHIATRIC HISTORY:  Recently discharged from [facility] on May 30, 2001.  Sees Dr. Lourdes Sledge as an outpatient.  SOCIAL HISTORY:  She is a 30 year old single white female.  She has a 31-month-old child.  She lives with her child and her mother is caring for the child at this time.  FAMILY HISTORY:  None.  ALCOHOL/DRUG HISTORY:  The patient says she drinks occasionally.  Denies any substance abuse.  PRIMARY CARE PHYSICIAN:  Dr. Pecola Leisure at [facility] in [place].  MEDICAL PROBLEMS:  None.  MEDICATIONS:  Paxil 50 mg q.h.s., Risperdal 0.5 mg q.h.s.  DRUG ALLERGIES:  PENICILLIN, SULFA.  PHYSICAL EXAMINATION:  The patient appears as a well-nourished, young adult female without complaints.  Her vital signs were temperature 98.1, pulse 85, respirations 20, blood pressure 101/57.  The patient is 138 pounds.  She is 5 feet 7 inches tall.  MENTAL STATUS EXAMINATION:  She  is an alert, young adult, casually dressed, attractive female with good eye contact.  She is cooperative.  Speech is normal and relevant.  Mood is depressed.  Affect is teary-eyed at times. Thought processes are coherent.  There is no evidence of psychosis.  No auditory or visual hallucinations.  No suicidal or homicidal ideation.  No paranoia.  Cognitive function intact.  Oriented x 3.  Judgment is fair. Insight is fair.  DIAGNOSES: Axis I:    Major depression, recurrent. Axis II:   Deferred. Axis III:  None. Axis IV:   Problems with primary support group and other psychosocial            problems. Axis V:    Current 35; estimated this past year 65-70.  PLAN:  Voluntary admission to [facility] for depression and suicidal thoughts.  Contract for safety.  Check every 15 minutes.  Will continue routine medications.  Will obtain a urine pregnancy test.  The patient was education and medication-compliant.  Our goal is to stabilize mood and thinking so patient can be safe, to increase coping skills, to medication-compliant, to follow up with Dr. Lourdes Sledge.  TENTATIVE LENGTH OF STAY:  Three to four days. Dictated by:   Candi Leash. Orsini, N.P. Attending Physician:  Rachael Fee DD:  07/04/01 TD:  07/05/01 Job: [id number]

## 2010-09-12 NOTE — Op Note (Signed)
[facility]  Patient:    Sheila [name], Sheila [name]                       MRN: [id number] Proc. Date: 09/26/99 Adm. Date:  [id number] Attending:  Deniece Ree                           Operative Report  PREOPERATIVE DIAGNOSIS:       Intrauterine pregnancy, postdates with nonreassuring fetal heart rate pattern.  POSTOPERATIVE DIAGNOSIS:      Intrauterine pregnancy, postdates with nonreassuring fetal heart rate pattern.  Plus a viable female infant with Apgars of and 8 and 9 and cord pH of 7.34.  OPERATION:                    Primary low transverse cesarean section.  SURGEON:                      Deniece Ree, M.D.  ASSISTANT:  ANESTHESIA:                   Epidural.  ESTIMATED BLOOD LOSS:         500 cc.  DRAINS:                       Foley was left to straight drainage.  CONDITION:                    The patient tolerated the procedure well and returned to the recovery room in satisfactory condition.  DESCRIPTION OF PROCEDURE:     The patient was taken to the operating room and prepped and draped in the usual sterile fashion for a primary cesarean section. A low Pfannenstiel incision was made.  This was carried down to the fascia at which time the fascia was entered and excised the extent of the incision.  The midline was identified and rectus muscles separated.  The abdominal peritoneum was entered in a vertical fashion using Metzenbaum scissors.  The visceroperitoneum was then excised bilaterally toward the round ligaments following which the lower uterine segment was scored, entered in the midline, and bluntly dissected open.  The right hand was introduced and just prior to delivery of the head, I felt a very tight  nuchal cord which was reduced.  The nasopharynx was then sucked out with suction bulb.  Complete delivery was attempted at which time it was noted that the patient had a cord wrapped around the shoulder, under arm, and  around the lower body. his was removed and complete delivery was carried out without any problems.  The cord was clamped and the infant turned over to the pediatricians.  Cord pH was obtained following which cord blood was obtained.  At this point, the placenta as well as all products of conception were then manually removed from the uterine cavity. IV Pitocin as well as IV antibiotics were then begun.  The myometrium was then closed using #1 chromic in a running locking stitch followed by an imbricating stitch again using #1 chromic.  Reperitonealization was then carried out using 2-0 chromic in a running stitch.  Sponge, needle, and instrument counts were correct x 2. Hemostasis was present.  Tubes and ovaries appeared to be within normal limits.  The abdominal peritoneum was then closed using 2-0 chromic in a running stitch followed by closure of the fascia  using #1 Dexon in a running stitch.  The skin was closed with skin staples.  The procedure was terminated.  The patient tolerated  the procedure well and returned to the recovery room in satisfactory condition.  DD:  09/26/99 TD:  09/29/99 Job: [id number]

## 2010-09-12 NOTE — Discharge Summary (Signed)
[facility]  Patient:    Sheila [name], Sheila [name]                       MRN: [id number] Adm. Date:  [id number] Disc. Date: [id number] Attending:  Deniece Ree                           Discharge Summary  DISCHARGE DIAGNOSIS:          Intrauterine pregnancy at term with nonreassuring fetal heart pattern.  OPERATION:                    Primary low transverse cesarean section.  SUMMARY:                      The patient is a 30 year old primigravida at approximately [redacted] weeks gestation who was admitted for induction.  The patient had no prenatal complications.  IV Pitocin was started, following which the patient began having a moderate amount of variable and late decelerations. After this was observed for approximately one hour, the plan was then to proceed with a primary cesarean section with a nonreassuring fetal heart pattern.  The patient underwent a primary cesarean section, which she tolerated very well without any problems.  Postoperatively, she did very well and was discharged on the third postoperative day.  She was instructed on the possible complications and care following this type of surgery.  She was told to return to my office in four weeks for follow up evaluation or to call  me prior to that time should any problems arise. DD:  11/19/99 TD:  11/21/99 Job: [id number]

## 2010-09-12 NOTE — Discharge Summary (Signed)
[facility]  Patient:    Sheila [name], Sheila [name] Visit Number: [id number] MRN: [id number]          Service Type: PSY Attending Physician:  Jeanice Lim Dictated by:   Jeanice Lim, M.D. Admit Date:  07/23/2001 Discharge Date: 07/26/2001                             Discharge Summary  IDENTIFYING DATA:  This is a 30 year old separated Caucasian female, voluntarily admitted for depression and suicidal ideation.  ADMISSION MEDICATIONS:  Paxil 50 mg CR, Risperdal 0.5 q.h.s.  ALLERGIES:  PENICILLIN, SULFA.  PHYSICAL EXAMINATION:  Essentially within normal limits, neurologically nonfocal.  ROUTINE ADMISSION LABS:  Essentially within normal limits.  MENTAL STATUS EXAMINATION:  Alert, young adult, casually dressed, attractive female with good eye contact.  Speech was within normal limits.  She is cooperative.  Mood depressed, affect teary-eyed at times.  Thought processes goal directed.  Thought content negative for psychotic symptoms or suicidal or homicidal ideations.  Cognitively intact.  ADMISSION DIAGNOSES: Axis I:    Major depression, recurrent. Axis II:   None. Axis III:  None. Axis IV:   Problems with primary support, moderate. Axis V:    35/60.  HOSPITAL COURSE:  The patient was admitted and ordered routine p.r.n. medications, and the patient was resumed on previous medications, continued on Paxil and optimized on Risperdal.  Risperdal was titrated as tolerated to stabilize mood, irritability and improve reality testing.  The patient reported some suicidal thoughts after meeting with the family and worked on developing a crisis plan and increasing a support system.  BuSpar was added for anxiety.  The patient tolerated medication changes well.  CONDITION ON DISCHARGE:  Improved.  Mood was more stable, affect brighter, thought process goal directed.  Thought content negative for dangerous ideation or psychotic symptoms.  DISCHARGE  MEDICATIONS: 1. Risperdal 0.5 mg 1/2 q.a.m., 1/2 q.3 p.m., 1 q.h.s. 2. Paxil CR 25 mg 2 q.6 p.m. 3. BuSpar 10 mg t.i.d. 4. Trazodone 75 mg q.h.s.  DISPOSITION:  The patient was to follow up with Dr. Lourdes Sledge on March 20 at 1 p.m.  DISCHARGE DIAGNOSES: Axis I:    Major depression, recurrent. Axis II:   None. Axis III:  None. Axis IV:   Problems with primary support, moderate. Axis V:    Global assessment of function at discharge was 55. Dictated by:   Jeanice Lim, M.D. Attending Physician:  Jeanice Lim DD:  08/10/01 TD:  08/12/01 Job: [id number]

## 2010-09-12 NOTE — H&P (Signed)
[facility]  Patient:    JANNY, CRUTE                       MRN: [id number] Adm. Date:  [id number] Attending:  Deniece Ree                         History and Physical  HISTORY:                      The patient is a 30 year old primigravida, approximately [redacted] weeks gestation, with an estimated date of confinement of Sep 14, 1999 by dates and Sep 12, 1999 by ultrasound.  The patient was admitted for induction.  Patient has had no prenatal complications.  After the patient was admitted, IV Pitocin was shortly begun.  Initially, the patient had moderate amount of variable and late decelerations.  However, after the first contraction, the heart rate cleared.  She did very well until approximately eight hours later, at 3 cm.  The patient began having deep variable and late decelerations with each contraction.  The Pitocin was halted and observation was then monitored for approximately one hour, during which time the decelerations increased, and at which time the decision was to proceed with a primary cesarean section.  PHYSICAL EXAMINATION AT THIS TIME:  GENERAL:                      Revealed a well-developed, well-nourished gravid female in labor-type distress.  HEENT:                        Within normal limits.  NECK:                         Supple.  BREASTS:                      Without masses, tenderness, or discharge.  LUNGS:                        Clear to percussion and auscultation.  HEART:                        Normal sinus rhythm without murmur, rub, or gallop.  ABDOMEN:                      Term gravid.  Fetal heart beats 120-140 in the left lower quadrant.  EXTREMITIES AND NEUROLOGIC:   Within normal limits.  PELVIC:                       Revealed vagina with clear fluid protruding from the cervical os, a cervix that was 3 cm dilated, 90% effaced, a vertex presentation, at a 0 to -1 station.  DIAGNOSIS AT THIS TIME:        Intrauterine pregnancy post dates, approximately [redacted] weeks gestation with a nonreassuring fetal heart pattern.  PLAN:                         Primary cesarean section. DD:  09/26/99 TD:  09/26/99 Job: [id number]
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
## 2010-11-07 ENCOUNTER — Telehealth: Payer: Self-pay | Admitting: Family Medicine

## 2010-11-07 NOTE — Telephone Encounter (Signed)
Refill request for Alprazolam 1 mg, pt last here on 04/16/10 and last filled on 10/08/10.

## 2010-11-10 MED ORDER — ALPRAZOLAM 1 MG PO TABS: 1.0000 mg | ORAL_TABLET | Freq: Two times a day (BID) | ORAL | Status: DC

## 2010-11-10 NOTE — Telephone Encounter (Signed)
done

## 2010-11-10 NOTE — Telephone Encounter (Signed)
Call in 1 mg bid, #60 with 5 rf

## 2010-12-02 ENCOUNTER — Encounter: Payer: Self-pay | Admitting: Family Medicine

## 2010-12-05 ENCOUNTER — Ambulatory Visit (INDEPENDENT_AMBULATORY_CARE_PROVIDER_SITE_OTHER): Payer: 59 | Admitting: Family Medicine

## 2010-12-05 ENCOUNTER — Encounter: Payer: Self-pay | Admitting: Family Medicine

## 2010-12-05 VITALS — BP 110/70 | HR 107 | Temp 98.5°F | Wt 214.0 lb

## 2010-12-05 DIAGNOSIS — F17200 Nicotine dependence, unspecified, uncomplicated: Secondary | ICD-10-CM

## 2010-12-05 DIAGNOSIS — R51 Headache: Secondary | ICD-10-CM

## 2010-12-05 MED ORDER — SUMATRIPTAN SUCCINATE 100 MG PO TABS: 100.0000 mg | ORAL_TABLET | Freq: Once | ORAL | Status: DC | PRN

## 2010-12-05 NOTE — Progress Notes (Signed)
  Subjective:    Patient ID: Sheila [name], female    DOB: 1981-02-03, 30 y.o.   MRN: [id number]  HPI Here for worsening migraines and for help to quit smoking. Over the past month she has averaged 1-2 migraines a week, which is much more often than in the past. No recent changes in diet or medications. She uses Excedrin Migraine with mixed results. She used Imitrex successfully in the past but has not needed it until lately. Also she wants to quit smoking but has never tried before. Her insurance does not cover Chantix, so she aks for other ways to help.    Review of Systems  Constitutional: Negative.   Respiratory: Negative.   Cardiovascular: Negative.   Neurological: Positive for headaches.       Objective:   Physical Exam  Constitutional: She is oriented to person, place, and time. She appears well-developed and well-nourished.  HENT:  Head: Normocephalic and atraumatic.  Right Ear: External ear normal.  Left Ear: External ear normal.  Nose: Nose normal.  Mouth/Throat: Oropharynx is clear and moist.  Eyes: Conjunctivae are normal. Pupils are equal, round, and reactive to light.  Neurological: She is alert and oriented to person, place, and time. No cranial nerve deficit. She exhibits abnormal muscle tone. Coordination normal.  Psychiatric: She has a normal mood and affect. Her behavior is normal.          Assessment & Plan:  Use Sumatriptan prn for the migraines. I suggested she use OTC nicotine patches, drink lots of water, and get more exercise to quit smoking. We filled out FMLA forms to cover her for up to 4 days a month to be absent from work due to migraines.

## 2011-02-25 ENCOUNTER — Telehealth: Payer: Self-pay | Admitting: Family Medicine

## 2011-02-25 NOTE — Telephone Encounter (Signed)
Pt was put on patches to quit smoking but it is not helping her quit she was wanting to try another option. Please contact pt.

## 2011-02-26 MED ORDER — VARENICLINE TARTRATE 1 MG PO TABS: 1.0000 mg | ORAL_TABLET | Freq: Two times a day (BID) | ORAL | Status: AC

## 2011-02-26 MED ORDER — VARENICLINE TARTRATE 0.5 MG X 11 & 1 MG X 42 PO MISC: ORAL | Status: AC

## 2011-02-26 NOTE — Telephone Encounter (Signed)
I spoke with pt and she wants to try the Chantix. Please send to her local pharmacy listed in chart.

## 2011-02-26 NOTE — Telephone Encounter (Signed)
The only other options are OTC gum or Chantix. I know the Chantix would have to be paid for out of pocket, but that would still be cheaper than buying cigarettes. We can call this in if she wants

## 2011-03-02 ENCOUNTER — Encounter: Payer: Self-pay | Admitting: Family Medicine

## 2011-03-02 ENCOUNTER — Ambulatory Visit (INDEPENDENT_AMBULATORY_CARE_PROVIDER_SITE_OTHER): Payer: 59 | Admitting: Family Medicine

## 2011-03-02 VITALS — BP 118/84 | HR 103 | Temp 98.3°F | Wt 207.0 lb

## 2011-03-02 DIAGNOSIS — S3992XA Unspecified injury of lower back, initial encounter: Secondary | ICD-10-CM

## 2011-03-02 DIAGNOSIS — S335XXA Sprain of ligaments of lumbar spine, initial encounter: Secondary | ICD-10-CM

## 2011-03-02 MED ORDER — HYDROCODONE-ACETAMINOPHEN 5-500 MG PO TABS: 1.0000 | ORAL_TABLET | ORAL | Status: DC | PRN

## 2011-03-02 MED ORDER — PREDNISONE (PAK) 10 MG PO TABS: ORAL_TABLET | ORAL | Status: DC

## 2011-03-02 MED ORDER — CYCLOBENZAPRINE HCL 10 MG PO TABS: 10.0000 mg | ORAL_TABLET | Freq: Three times a day (TID) | ORAL | Status: DC | PRN

## 2011-03-02 NOTE — Progress Notes (Signed)
  Subjective:    Patient ID: Sheila [name], female    DOB: 1981-01-04, 31 y.o.   MRN: [id number]  HPI Here for low back pains since she bent over to pick up her son 4 days ago. The pains are sharp and severe, located in the center of the lower back. Using heat and Motrin.    Review of Systems  Constitutional: Negative.   Musculoskeletal: Positive for back pain.       Objective:   Physical Exam  Constitutional:       In pain   Abdominal: Soft. Bowel sounds are normal. She exhibits no distension and no mass. There is no tenderness. There is no rebound and no guarding.  Musculoskeletal:       Very tender in the lower back with decreased ROM, and a lot of spasm          Assessment & Plan:  Rest, heat

## 2011-04-24 ENCOUNTER — Telehealth: Payer: Self-pay | Admitting: *Deleted

## 2011-04-24 MED ORDER — ALPRAZOLAM 1 MG PO TABS: 1.0000 mg | ORAL_TABLET | Freq: Two times a day (BID) | ORAL | Status: DC

## 2011-04-24 NOTE — Telephone Encounter (Signed)
Calling to check status of rx refill request of alprazolam 1mg  requested on Wednsday by [facility]. Pt states she is out of meds.   (pt was advised of allotted timeframe of rx refills and that her pcp is out of the office this week.)

## 2011-04-24 NOTE — Telephone Encounter (Signed)
Spoke with pt and last got script filled on 04/03/11. (Xanax )  I explained that the doctor is out of the office and that it might be too soon to refill.

## 2011-04-24 NOTE — Telephone Encounter (Signed)
Can refill enough until dr fry gets back

## 2011-04-24 NOTE — Telephone Encounter (Signed)
Script called in and pt aware. 

## 2011-05-01 ENCOUNTER — Other Ambulatory Visit: Payer: Self-pay | Admitting: Family Medicine

## 2011-05-01 NOTE — Telephone Encounter (Signed)
Pt need refill on alprazolam 1 mg twice a day call into [facility] [phone]

## 2011-05-04 ENCOUNTER — Telehealth: Payer: Self-pay | Admitting: *Deleted

## 2011-05-04 NOTE — Telephone Encounter (Signed)
Call in #60 with 5 rf 

## 2011-05-04 NOTE — Telephone Encounter (Signed)
Refill on xanax 1mg  last filled on 04/27/11 1 bid.

## 2011-05-04 NOTE — Telephone Encounter (Signed)
Pt called again. Please call patient when done. Thanks.

## 2011-05-05 MED ORDER — ALPRAZOLAM 1 MG PO TABS: 1.0000 mg | ORAL_TABLET | Freq: Two times a day (BID) | ORAL | Status: DC

## 2011-05-05 NOTE — Telephone Encounter (Signed)
Pt is calling back again to check on status of Xanax refill.

## 2011-05-05 NOTE — Telephone Encounter (Signed)
Script called in and pt aware. 

## 2011-05-05 NOTE — Telephone Encounter (Signed)
I answered this yesterday. Please call these in

## 2011-06-08 ENCOUNTER — Other Ambulatory Visit: Payer: Self-pay | Admitting: Family Medicine

## 2011-07-07 ENCOUNTER — Telehealth: Payer: Self-pay | Admitting: Family Medicine

## 2011-07-07 NOTE — Telephone Encounter (Signed)
Pt family has outbreak of scabies requesting med call into [facility]. Pt has some spot on body/itching.

## 2011-07-08 MED ORDER — PERMETHRIN 5 % EX CREA: TOPICAL_CREAM | Freq: Once | CUTANEOUS | Status: AC

## 2011-07-08 NOTE — Telephone Encounter (Signed)
Call in Permethrin 5% cream to apply over the body, wash off after 8 hours. Give one bottle for each family member, and then one rf for each if needed

## 2011-07-08 NOTE — Telephone Encounter (Signed)
Pt called pharmacy and was told that she could not get it over the counter and is requesting an rx

## 2011-07-08 NOTE — Telephone Encounter (Signed)
Script sent e-scribe and I spoke with pt. 

## 2011-07-08 NOTE — Telephone Encounter (Signed)
Try Permethrin cream OTC for everyone

## 2011-11-18 ENCOUNTER — Other Ambulatory Visit: Payer: Self-pay | Admitting: Family Medicine

## 2011-11-18 ENCOUNTER — Telehealth: Payer: Self-pay | Admitting: Family Medicine

## 2011-11-18 NOTE — Telephone Encounter (Signed)
Refill request for Alprazolam 1 mg take 1 po bid and last here on 03/02/11.

## 2011-11-18 NOTE — Telephone Encounter (Signed)
Call in #60 with 5 rf 

## 2011-11-18 NOTE — Telephone Encounter (Signed)
Refill request for Alprazolam 1 mg take 1 po bid and last here on 03/02/11. 

## 2011-11-19 MED ORDER — ALPRAZOLAM 1 MG PO TABS: 1.0000 mg | ORAL_TABLET | Freq: Two times a day (BID) | ORAL | Status: DC

## 2011-11-19 NOTE — Telephone Encounter (Signed)
I called in script 

## 2011-12-01 ENCOUNTER — Ambulatory Visit (INDEPENDENT_AMBULATORY_CARE_PROVIDER_SITE_OTHER): Payer: 59 | Admitting: Family Medicine

## 2011-12-01 ENCOUNTER — Encounter: Payer: Self-pay | Admitting: Family Medicine

## 2011-12-01 VITALS — BP 110/60 | HR 117 | Temp 98.4°F | Wt 196.0 lb

## 2011-12-01 DIAGNOSIS — F909 Attention-deficit hyperactivity disorder, unspecified type: Secondary | ICD-10-CM

## 2011-12-01 DIAGNOSIS — R590 Localized enlarged lymph nodes: Secondary | ICD-10-CM

## 2011-12-01 DIAGNOSIS — R599 Enlarged lymph nodes, unspecified: Secondary | ICD-10-CM

## 2011-12-01 MED ORDER — CEPHALEXIN 500 MG PO CAPS: 500.0000 mg | ORAL_CAPSULE | Freq: Three times a day (TID) | ORAL | Status: AC

## 2011-12-01 MED ORDER — AMPHETAMINE-DEXTROAMPHET ER 20 MG PO CP24: 20.0000 mg | ORAL_CAPSULE | ORAL | Status: DC

## 2011-12-01 NOTE — Progress Notes (Signed)
  Subjective:    Patient ID: Sheila [name], female    DOB: 1981/01/21, 31 y.o.   MRN: [id number]  HPI Here for 2 reasons. First she asks to be treated for ADHD. She was diagnosed with this while a teenager, and she was treated with Ritalin for several years. This was very helpful to her at the time, but it caused a lot of side effects like HAs and sedation. She has been doing well off all meds for most of her adult life, but she recently had a dramatic increase in the demands of her job. She has been asked to perform the duties that several previous employees had to do, and this is overwhelming to her. She can't focus properly, and she tends to start many different tasks without finishing any of them. She cannot organize her time well. She asks to try a med again. Also she noticed a small slightly tender lump come up on the right side of her neck about 2 months ago. This has persisted ever since. The tenderness waxes and wanes, but the size of the lump never changes. She has never had this before, and there are no other lumps like this on her body. No recent scalp infections or insect bites or rashes. She feels fine in general.    Review of Systems  Constitutional: Negative.   HENT: Negative.   Eyes: Negative.   Respiratory: Negative.   Skin: Negative.   Psychiatric/Behavioral: Positive for decreased concentration. Negative for confusion, dysphoric mood and agitation. The patient is not nervous/anxious.        Objective:   Physical Exam  Constitutional: She is oriented to person, place, and time. She appears well-developed and well-nourished.  HENT:  Head: Normocephalic and atraumatic.  Right Ear: External ear normal.  Left Ear: External ear normal.  Nose: Nose normal.  Mouth/Throat: Oropharynx is clear and moist.  Eyes: Conjunctivae are normal. Pupils are equal, round, and reactive to light.  Neck: Neck supple. No thyromegaly present.       Single small tender lymph node on the right  lateral neck just behind the sternoclaidomastoid muscle. No other nodes are found  Neurological: She is alert and oriented to person, place, and time.  Psychiatric: She has a normal mood and affect. Her behavior is normal. Thought content normal.          Assessment & Plan:  Single reactive neck node. We will cover with 10 days of Keflex. Start on Adderall XR for the ADHD. Recheck both in 2-3 weeks

## 2011-12-14 ENCOUNTER — Telehealth: Payer: Self-pay | Admitting: Family Medicine

## 2011-12-14 NOTE — Telephone Encounter (Signed)
Yes, this is okay per Dr. Clent Ridges.

## 2011-12-14 NOTE — Telephone Encounter (Signed)
Pt is sch for 01-01-2012 230pm

## 2011-12-14 NOTE — Telephone Encounter (Signed)
Pt would like appt on 01-01-2012 after 2pm. Can I use sda slot for follow up on ADD med ?

## 2012-01-01 ENCOUNTER — Ambulatory Visit (INDEPENDENT_AMBULATORY_CARE_PROVIDER_SITE_OTHER): Payer: 59 | Admitting: Family Medicine

## 2012-01-01 ENCOUNTER — Encounter: Payer: Self-pay | Admitting: Family Medicine

## 2012-01-01 VITALS — BP 116/70 | HR 117 | Temp 98.3°F | Wt 200.0 lb

## 2012-01-01 DIAGNOSIS — F909 Attention-deficit hyperactivity disorder, unspecified type: Secondary | ICD-10-CM

## 2012-01-01 DIAGNOSIS — R1013 Epigastric pain: Secondary | ICD-10-CM

## 2012-01-01 MED ORDER — AMPHETAMINE-DEXTROAMPHET ER 20 MG PO CP24: 20.0000 mg | ORAL_CAPSULE | Freq: Two times a day (BID) | ORAL | Status: DC

## 2012-01-01 NOTE — Progress Notes (Signed)
  Subjective:    Patient ID: Sheila [name], female    DOB: November 07, 1980, 31 y.o.   MRN: [id number]  HPI Here to follow up on ADHD and to ask about intermittent upper abdominal cramps. No nausea or heartburn. Bms and urinations are normal. She is pleased with how Adderall XR helps her in the mornings, since she can focus better and perform better at work. However it seems to run out and stop working in the afternoons. No side effects .   Review of Systems  Constitutional: Negative.   Respiratory: Negative.   Cardiovascular: Negative.   Gastrointestinal: Positive for abdominal pain. Negative for nausea, vomiting, diarrhea, constipation, blood in stool and abdominal distention.  Neurological: Negative.   Psychiatric/Behavioral: Negative.        Objective:   Physical Exam  Constitutional: She is oriented to person, place, and time. She appears well-developed and well-nourished.  Cardiovascular: Normal rate, regular rhythm, normal heart sounds and intact distal pulses.   Pulmonary/Chest: Effort normal and breath sounds normal.  Abdominal: Soft. Bowel sounds are normal. She exhibits no distension and no mass. There is no tenderness. There is no rebound and no guarding.  Neurological: She is alert and oriented to person, place, and time.          Assessment & Plan:  We will increase the Adderall XR to 20 mg bid. She seems to have some GERD so she will try Prilosec OTC daily. Recheck one month.

## 2012-01-07 ENCOUNTER — Ambulatory Visit
Admission: RE | Admit: 2012-01-07 | Discharge: 2012-01-07 | Disposition: A | Discharge status: Home / Self Care | Payer: 59 | Source: Ambulatory Visit | Attending: Family Medicine | Admitting: Family Medicine

## 2012-01-07 DIAGNOSIS — R1013 Epigastric pain: Secondary | ICD-10-CM

## 2012-01-07 NOTE — Addendum Note (Signed)
Addended by: Gershon Crane A on: 01/07/2012 01:09 PM   Modules accepted: Orders

## 2012-01-08 NOTE — Progress Notes (Signed)
Quick Note:  I spoke with pt ______ 

## 2012-01-20 ENCOUNTER — Encounter (INDEPENDENT_AMBULATORY_CARE_PROVIDER_SITE_OTHER): Payer: Self-pay | Admitting: Surgery

## 2012-01-20 ENCOUNTER — Ambulatory Visit (INDEPENDENT_AMBULATORY_CARE_PROVIDER_SITE_OTHER): Payer: 59 | Admitting: Surgery

## 2012-01-20 VITALS — BP 128/82 | HR 70 | Temp 97.4°F | Resp 16 | Ht 67.0 in | Wt 198.0 lb

## 2012-01-20 DIAGNOSIS — K8010 Calculus of gallbladder with chronic cholecystitis without obstruction: Secondary | ICD-10-CM | POA: Insufficient documentation

## 2012-01-20 DIAGNOSIS — E669 Obesity, unspecified: Secondary | ICD-10-CM | POA: Insufficient documentation

## 2012-01-20 DIAGNOSIS — Z720 Tobacco use: Secondary | ICD-10-CM

## 2012-01-20 NOTE — Progress Notes (Signed)
Subjective:     Patient ID: Sheila [name], female   DOB: 01/13/1981, 31 y.o.   MRN: [id number]  HPI  Sheila [name]  December 27, 1980 [id number]  Patient Care Team: Nelwyn Salisbury, MD as PCP - General  This patient is a 31 y.o.female who presents today for surgical evaluation at the request of Dr Clent Ridges.   Reason for evaluation: Intermittent right upper quadrant pain.  Gallstones.  Pleasant obese female with intermittent right upper quadrant abdominal pain for several months.  Usually associated when she is feeling hungry.  Often worse after a [place] food or occasional rich foods.  Numerous attacks.  Will radiate to the back sometimes.  Sometimes crossover to the left side.  No belching.  Some bloating.  Some mild nausea but no emesis.  No history of heartburn or reflux.  No personal nor family history of GI/colon cancer, inflammatory bowel disease, irritable bowel syndrome, allergy such as Celiac Sprue, dietary/dairy problems, colitis, ulcers nor gastritis.  No recent sick contacts/gastroenteritis.  No travel outside the country.  No changes in diet.  Patient walks 30 minutes for about 2 miles without difficulty.  No exertional chest/neck/shoulder/arm pain.    Because of the persistent and more frequent symptoms, she discuss with her primary care physician.  Dr. Clent Ridges ordered an ultrasound.  He suspected symptomatic gallstones.  He sent the patient to [place] for evaluation.   Patient Active Problem List  Diagnosis  . ANEMIA-IRON DEFICIENCY  . ANXIETY  . DEPRESSION  . CONJUNCTIVITIS  . SORE THROAT  . VIRAL URI  . HEADACHE    Past Medical History  Diagnosis Date  . Anemia   . Depression   . Anxiety   . Migraine headache   . ADHD (attention deficit hyperactivity disorder)   . Abdominal pain   . Gum disease   . Gallstones     Past Surgical History  Procedure Date  . Cesarean section     x 2    History   Social History  . Marital Status: Single    Spouse Name: N/A    Number of  Children: N/A  . Years of Education: N/A   Occupational History  . 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 Smoking status: Current Every Day Smoker -- 0.5 packs/day    Types: Cigarettes  . Smokeless tobacco: Never Used  . Alcohol Use: Yes     once every other month  . Drug Use: No  . Sexually Active: Not on file   Other Topics Concern  . Not on file   Social History Narrative  . No narrative on file    Family History  Problem Relation Age of Onset  . Cancer      breast/fhx  . Depression      fhx    Current Outpatient Prescriptions  Medication Sig Dispense Refill  . clindamycin (CLEOCIN) 300 MG capsule 4 times daily.      Marland Kitchen HYDROcodone-acetaminophen (VICODIN) 5-500 MG per tablet as needed.      . ALPRAZolam (XANAX) 1 MG tablet Take 1 mg by mouth 2 (two) times daily as needed.      Marland Kitchen amphetamine-dextroamphetamine (ADDERALL XR) 20 MG 24 hr capsule Take 1 capsule (20 mg total) by mouth 2 (two) times daily.  60 capsule  0  . citalopram (CELEXA) 40 MG tablet TAKE 1 TABLET BY MOUTH EVERY DAY  60 tablet  5  . SUMAtriptan (IMITREX) 100 MG  tablet Take 1 tablet (100 mg total) by mouth once as needed for migraine.  12 tablet  11     Allergies  Allergen Reactions  . Penicillins Rash    All over the body  . Sulfonamide Derivatives Rash    All over the body    BP 128/82  Pulse 70  Temp 97.4 F (36.3 C) (Temporal)  Resp 16  Ht 5\' 7"  (1.702 m)  Wt 198 lb (89.812 kg)  BMI 31.01 kg/m2  LMP 12/18/2011  US Abdomen Complete  01/07/2012  *RADIOLOGY REPORT*  Clinical Data:  Epigastric pain, particularly right upper quadrant  COMPLETE ABDOMINAL ULTRASOUND  Comparison:  None.  Findings:  Gallbladder:  The gallbladder is visualized and several gallstones are present, none larger than 8 mm in diameter.  There is no pain over the gallbladder with compression currently.  Common bile duct:  The common bile duct is normal measuring up to 5.7 mm in diameter.  Liver:  The  liver has a normal echogenic pattern.  No ductal dilatation is seen.  IVC:  Appears normal.  Pancreas:  No focal abnormality seen.  Spleen:  The spleen is normal measuring 11.3 cm sagittally.  Right Kidney:  No hydronephrosis is seen.  The right kidney measures 12.1 cm sagittally.  Left Kidney:  No hydronephrosis is noted.  The left kidney measures 11.3 cm.  Abdominal aorta:  The abdominal aorta is normal in caliber.  IMPRESSION:  1.  Several gallstones, none larger than 8 mm in diameter.  No present evidence of acute cholecystitis. 2.  No ductal dilatation.   Original Report Authenticated By: Juline Patch, M.D.      Review of Systems  Constitutional: Negative for fever, chills, diaphoresis, appetite change and fatigue.  HENT: Negative for ear pain, sore throat, trouble swallowing, neck pain and ear discharge.   Eyes: Negative for photophobia, discharge and visual disturbance.  Respiratory: Negative for cough, choking, chest tightness and shortness of breath.   Cardiovascular: Negative for chest pain and palpitations.  Gastrointestinal: Positive for nausea and abdominal pain. Negative for vomiting, diarrhea, constipation, blood in stool, abdominal distention, anal bleeding and rectal pain.  Genitourinary: Negative for dysuria, urgency, frequency, hematuria, vaginal bleeding, vaginal discharge, difficulty urinating, genital sores, vaginal pain and pelvic pain.  Musculoskeletal: Positive for back pain. Negative for myalgias, joint swelling, arthralgias and gait problem.  Skin: Negative for color change, pallor and rash.  Neurological: Negative for dizziness, speech difficulty, weakness and numbness.  Hematological: Negative for adenopathy.  Psychiatric/Behavioral: Negative for confusion and agitation. The patient is not nervous/anxious.        Objective:   Physical Exam  Constitutional: She is oriented to person, place, and time. She appears well-developed and well-nourished. No distress.    HENT:  Head: Normocephalic.  Mouth/Throat: Oropharynx is clear and moist. No oropharyngeal exudate.  Eyes: Conjunctivae normal and EOM are normal. Pupils are equal, round, and reactive to light. No scleral icterus.  Neck: Normal range of motion. Neck supple. No tracheal deviation present.  Cardiovascular: Normal rate, regular rhythm and intact distal pulses.   Pulmonary/Chest: Effort normal and breath sounds normal. No respiratory distress. She exhibits no tenderness.  Abdominal: Soft. Bowel sounds are normal. She exhibits no distension, no pulsatile liver and no mass. There is tenderness in the right upper quadrant. There is no rigidity, no guarding, no CVA tenderness, no tenderness at McBurney's point and negative Murphy's sign. No hernia. Hernia confirmed negative in the right inguinal area and confirmed  negative in the left inguinal area.  Genitourinary: No vaginal discharge found.  Musculoskeletal: Normal range of motion. She exhibits no tenderness.  Lymphadenopathy:    She has no cervical adenopathy.       Right: No inguinal adenopathy present.       Left: No inguinal adenopathy present.  Neurological: She is alert and oriented to person, place, and time. No cranial nerve deficit. She exhibits normal muscle tone. Coordination normal.  Skin: Skin is warm and dry. No rash noted. She is not diaphoretic. No erythema.  Psychiatric: She has a normal mood and affect. Her behavior is normal. Judgment and thought content normal.       Assessment:     Intermittent right upper quadrant pain associated with eating, now more constant.  Strongly suspicious for chronic cholecystitis.  Gallstones on ultrasound.    Plan:     Given the fact that the rest of the differential diagnosis seems unlikely, I think she would benefit from cholecystectomy.  She is a reasonable candidate for a single site approach.  Prior supraumbilical piercing but not using it for the past eight years the  The anatomy &  physiology of hepatobiliary & pancreatic function was discussed.  The pathophysiology of gallbladder dysfunction was discussed.  Natural history risks without surgery was discussed.   I feel the risks of no intervention will lead to serious problems that outweigh the operative risks; therefore, I recommended cholecystectomy to remove the pathology.  I explained laparoscopic techniques with possible need for an open approach.  Probable cholangiogram to evaluate the bilary tract was explained as well.    Risks such as bleeding, infection, abscess, leak, injury to other organs, need for further treatment, heart attack, death, and other risks were discussed.  I noted a good likelihood this will help address the problem.  Possibility that this will not correct all abdominal symptoms was explained.  Goals of post-operative recovery were discussed as well.  We will work to minimize complications.  An educational handout further explaining the pathology and treatment options was given as well.  Questions were answered.  The patient expresses understanding & wishes to proceed with surgery.  We talked to the patient about the dangers of smoking.  We stressed that tobacco use dramatically increases the risk of peri-operative complications such as infection, tissue necrosis leaving to problems with incision/wound and organ healing, heart attack, stroke, DVT, pulmonary embolism, and death.  We noted there are programs in our community to help stop smoking.

## 2012-01-20 NOTE — Patient Instructions (Addendum)
See the Handout(s) we gave you.  Consider surgery.  Please call our office at [phone] if you wish to schedule surgery or if you have further questions / concerns.   Smoking Cessation, Tips for Success YOU CAN QUIT SMOKING If you are ready to quit smoking, congratulations! You have chosen to help yourself be healthier. Cigarettes bring nicotine, tar, carbon monoxide, and other irritants into your body. Your lungs, heart, and blood vessels will be able to work better without these poisons. There are many different ways to quit smoking. Nicotine gum, nicotine patches, a nicotine inhaler, or nicotine nasal spray can help with physical craving. Hypnosis, support groups, and medicines help break the habit of smoking. Here are some tips to help you quit for good.  Throw away all cigarettes.   Clean and remove all ashtrays from your home, work, and car.   On a card, write down your reasons for quitting. Carry the card with you and read it when you get the urge to smoke.   Cleanse your body of nicotine. Drink enough water and fluids to keep your urine clear or pale yellow. Do this after quitting to flush the nicotine from your body.   Learn to predict your moods. Do not let a bad situation be your excuse to have a cigarette. Some situations in your life might tempt you into wanting a cigarette.   Never have "just one" cigarette. It leads to wanting another and another. Remind yourself of your decision to quit.   Change habits associated with smoking. If you smoked while driving or when feeling stressed, try other activities to replace smoking. Stand up when drinking your coffee. Brush your teeth after eating. Sit in a different chair when you read the paper. Avoid alcohol while trying to quit, and try to drink fewer caffeinated beverages. Alcohol and caffeine may urge you to smoke.   Avoid foods and drinks that can trigger a desire to smoke, such as sugary or spicy foods and alcohol.   Ask  people who smoke not to smoke around you.   Have something planned to do right after eating or having a cup of coffee. Take a walk or exercise to perk you up. This will help to keep you from overeating.   Try a relaxation exercise to calm you down and decrease your stress. Remember, you may be tense and nervous for the first 2 weeks after you quit, but this will pass.   Find new activities to keep your hands busy. Play with a pen, coin, or rubber band. Doodle or draw things on paper.   Brush your teeth right after eating. This will help cut down on the craving for the taste of tobacco after meals. You can try mouthwash, too.   Use oral substitutes, such as lemon drops, carrots, a cinnamon stick, or chewing gum, in place of cigarettes. Keep them handy so they are available when you have the urge to smoke.   When you have the urge to smoke, try deep breathing.   Designate your home as a nonsmoking area.   If you are a heavy smoker, ask your caregiver about a prescription for nicotine chewing gum. It can ease your withdrawal from nicotine.   Reward yourself. Set aside the cigarette money you save and buy yourself something nice.   Look for support from others. Join a support group or smoking cessation program. Ask someone at home or at work to help you with your plan to quit smoking.  Always ask yourself, "Do I need this cigarette or is this just a reflex?" Tell yourself, "Today, I choose not to smoke," or "I do not want to smoke." You are reminding yourself of your decision to quit, even if you do smoke a cigarette.  HOW WILL I FEEL WHEN I QUIT SMOKING?  The benefits of not smoking start within days of quitting.   You may have symptoms of withdrawal because your body is used to nicotine (the addictive substance in cigarettes). You may crave cigarettes, be irritable, feel very hungry, cough often, get headaches, or have difficulty concentrating.   The withdrawal symptoms are only  temporary. They are strongest when you first quit but will go away within 10 to 14 days.   When withdrawal symptoms occur, stay in control. Think about your reasons for quitting. Remind yourself that these are signs that your body is healing and getting used to being without cigarettes.   Remember that withdrawal symptoms are easier to treat than the major diseases that smoking can cause.   Even after the withdrawal is over, expect periodic urges to smoke. However, these cravings are generally short-lived and will go away whether you smoke or not. Do not smoke!   If you relapse and smoke again, do not lose hope. Most smokers quit 3 times before they are successful.   If you relapse, do not give up! Plan ahead and think about what you will do the next time you get the urge to smoke.  LIFE AS A NONSMOKER: MAKE IT FOR A MONTH, MAKE IT FOR LIFE Day 1: Hang this page where you will see it every day. Day 2: Get rid of all ashtrays, matches, and lighters. Day 3: Drink water. Breathe deeply between sips. Day 4: Avoid places with smoke-filled air, such as bars, clubs, or the smoking section of restaurants. Day 5: Keep track of how much money you save by not smoking. Day 6: Avoid boredom. Keep a good book with you or go to the movies. Day 7: Reward yourself! One week without smoking! Day 8: Make a dental appointment to get your teeth cleaned. Day 9: Decide how you will turn down a cigarette before it is offered to you. Day 10: Review your reasons for quitting. Day 11: Distract yourself. Stay active to keep your mind off smoking and to relieve tension. Take a walk, exercise, read a book, do a crossword puzzle, or try a new hobby. Day 12: Exercise. Get off the bus before your stop or use stairs instead of escalators. Day 13: Call on friends for support and encouragement. Day 14: Reward yourself! Two weeks without smoking! Day 15: Practice deep breathing exercises. Day 16: Bet a friend that you can  stay a nonsmoker. Day 17: Ask to sit in nonsmoking sections of restaurants. Day 18: Hang up "No Smoking" signs. Day 19: Think of yourself as a nonsmoker. Day 20: Each morning, tell yourself you will not smoke. Day 21: Reward yourself! Three weeks without smoking! Day 22: Think of smoking in negative ways. Remember how it stains your teeth, gives you bad breath, and leaves you short of breath. Day 23: Eat a nutritious breakfast. Day 24:Do not relive your days as a smoker. Day 25: Hold a pencil in your hand when talking on the telephone. Day 26: Tell all your friends you do not smoke. Day 27: Think about how much better food tastes. Day 28: Remember, one cigarette is one too many. Day 29: Take up a hobby  that will keep your hands busy. Day 30: Congratulations! One month without smoking! Give yourself a big reward. Your caregiver can direct you to community resources or hospitals for support, which may include:  Group support.   Education.   Hypnosis.   Subliminal therapy.  Document Released: 01/10/2004 Document Revised: 04/02/2011 Document Reviewed: 01/28/2009 [facility] Patient Information 2012 [place], [place].

## 2012-02-01 ENCOUNTER — Other Ambulatory Visit: Payer: Self-pay | Admitting: Family Medicine

## 2012-02-01 MED ORDER — AMPHETAMINE-DEXTROAMPHET ER 20 MG PO CP24: 20.0000 mg | ORAL_CAPSULE | Freq: Two times a day (BID) | ORAL | Status: DC

## 2012-02-01 NOTE — Telephone Encounter (Signed)
Done

## 2012-02-01 NOTE — Telephone Encounter (Signed)
Scripts are ready for pick up and I left a voice message for pt. 

## 2012-02-01 NOTE — Telephone Encounter (Signed)
Pt needs new rx generic adderall xr 20 mg °

## 2012-02-20 ENCOUNTER — Other Ambulatory Visit: Payer: Self-pay | Admitting: Family Medicine

## 2012-04-15 ENCOUNTER — Other Ambulatory Visit (INDEPENDENT_AMBULATORY_CARE_PROVIDER_SITE_OTHER): Payer: Self-pay | Admitting: Surgery

## 2012-04-15 DIAGNOSIS — K8010 Calculus of gallbladder with chronic cholecystitis without obstruction: Secondary | ICD-10-CM

## 2012-04-15 HISTORY — PX: CHOLECYSTECTOMY: SHX55

## 2012-04-21 ENCOUNTER — Telehealth (INDEPENDENT_AMBULATORY_CARE_PROVIDER_SITE_OTHER): Payer: Self-pay | Admitting: General Surgery

## 2012-04-21 NOTE — Telephone Encounter (Signed)
Message copied by Liliana Cline on Thu Apr 21, 2012  9:36 AM ------      Message from: Larry Sierras      Created: Thu Apr 21, 2012  8:31 AM      Regarding: RTW DATE      Contact: [phone]       1ST CK 05-16-12 FOR [facility]is TO RTW? PLEASE CALL/GM

## 2012-04-21 NOTE — Telephone Encounter (Signed)
RTW note written and mailed to patient per her request.

## 2012-04-26 ENCOUNTER — Telehealth: Payer: Self-pay | Admitting: Family Medicine

## 2012-04-26 NOTE — Telephone Encounter (Signed)
Patient called stating that she need a refill of her adderall xr 20mg  1po BID. Please assist.

## 2012-05-02 MED ORDER — AMPHETAMINE-DEXTROAMPHET ER 20 MG PO CP24: 20.0000 mg | ORAL_CAPSULE | Freq: Two times a day (BID) | ORAL | Status: DC

## 2012-05-02 NOTE — Telephone Encounter (Signed)
Script is ready for pick up and I spoke with pt.  

## 2012-05-02 NOTE — Telephone Encounter (Signed)
done

## 2012-05-16 ENCOUNTER — Encounter (INDEPENDENT_AMBULATORY_CARE_PROVIDER_SITE_OTHER): Payer: 59 | Admitting: Surgery

## 2012-05-23 ENCOUNTER — Other Ambulatory Visit: Payer: Self-pay | Admitting: Family Medicine

## 2012-05-23 NOTE — Telephone Encounter (Signed)
Pt following up on med request. °

## 2012-05-24 NOTE — Telephone Encounter (Signed)
Call in #60 with 5 rf 

## 2012-06-02 ENCOUNTER — Ambulatory Visit (INDEPENDENT_AMBULATORY_CARE_PROVIDER_SITE_OTHER): Payer: 59 | Admitting: Internal Medicine

## 2012-06-02 ENCOUNTER — Encounter: Payer: Self-pay | Admitting: Internal Medicine

## 2012-06-02 VITALS — BP 110/74 | HR 93 | Temp 98.0°F | Wt 213.0 lb

## 2012-06-02 DIAGNOSIS — Z20818 Contact with and (suspected) exposure to other bacterial communicable diseases: Secondary | ICD-10-CM

## 2012-06-02 DIAGNOSIS — J069 Acute upper respiratory infection, unspecified: Secondary | ICD-10-CM

## 2012-06-02 DIAGNOSIS — J029 Acute pharyngitis, unspecified: Secondary | ICD-10-CM

## 2012-06-02 DIAGNOSIS — F17200 Nicotine dependence, unspecified, uncomplicated: Secondary | ICD-10-CM

## 2012-06-02 DIAGNOSIS — Z720 Tobacco use: Secondary | ICD-10-CM | POA: Insufficient documentation

## 2012-06-02 DIAGNOSIS — Z2089 Contact with and (suspected) exposure to other communicable diseases: Secondary | ICD-10-CM

## 2012-06-02 MED ORDER — CEPHALEXIN 500 MG PO CAPS: 500.0000 mg | ORAL_CAPSULE | Freq: Two times a day (BID) | ORAL | Status: DC

## 2012-06-02 NOTE — Progress Notes (Signed)
Chief Complaint  Patient presents with  . Sore Throat  . Chills  . Nasal Congestion  . Headache    HPI: Patient comes in today for SDA for  new problem evaluation. 3 close contacts postitive for strep in the last week or so.  No hx of  Same.  Over the last day or 2 onset of symptoms with sore throat nasal congestion and very badly last night she has had chills but no documented fever no nausea vomiting or diarrhea or unusual rashes  Onset of sx  And very bad st this am.    ROS: See pertinent positives and negatives per HPI. Is still using tobacco but transitioning to electronic cigarettes and down to about 3 a day no history of pulmonary disease per se.  Past Medical History  Diagnosis Date  . Anemia   . Depression   . Anxiety   . Migraine headache   . ADHD (attention deficit hyperactivity disorder)   . Abdominal pain   . Gum disease   . Gallstones     Family History  Problem Relation Age of Onset  . Cancer      breast/fhx  . Depression      fhx    History   Social History  . Marital Status: Single    Spouse Name: N/A    Number of Children: N/A  . Years of Education: N/A   Occupational History  . 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 Smoking status: Current Every Day Smoker -- 0.5 packs/day    Types: Cigarettes  . Smokeless tobacco: Never Used  . Alcohol Use: Yes     Comment: once every other month  . Drug Use: No  . Sexually Active: None   Other Topics Concern  . None   Social History Narrative  . None    Outpatient Encounter Prescriptions as of 06/02/2012  Medication Sig Dispense Refill  . ALPRAZolam (XANAX) 1 MG tablet TAKE 1 TABLET BY MOUTH TWICE DAILY  60 tablet  5  . amphetamine-dextroamphetamine (ADDERALL XR) 20 MG 24 hr capsule Take 1 capsule (20 mg total) by mouth 2 (two) times daily.  60 capsule  0  . citalopram (CELEXA) 40 MG tablet TAKE 1 TABLET BY MOUTH EVERY DAY  60 tablet  5  . SUMAtriptan (IMITREX) 100 MG tablet  TAKE 1 TABLET BY MOUTH ONCE AS NEEDED FOR MIGRAINE.  12 tablet  11  . cephALEXin (KEFLEX) 500 MG capsule Take 1 capsule (500 mg total) by mouth 2 (two) times daily.  20 capsule  0  . clindamycin (CLEOCIN) 300 MG capsule 4 times daily.      . [DISCONTINUED] HYDROcodone-acetaminophen (VICODIN) 5-500 MG per tablet as needed.        EXAM:  BP 110/74  Pulse 93  Temp 98 F (36.7 C) (Oral)  Wt 213 lb (96.616 kg)  SpO2 98%  LMP 05/14/2012  There is no height on file to calculate BMI.  GENERAL: vitals reviewed and listed above, alert, oriented, appears well hydrated and in no acute distress congested looks mildly ill nontoxic HEENT: atraumatic, conjunctiva  clear, no obvious abnormalities on inspection of external nose and ears TMs are clear nares slightly congested face nontender OP : no lesion edema or exudate +2 redness no lesions noted good airway NECK: no obvious masses on inspection palpation no tender adenopathy  LUNGS: clear to auscultation bilaterally, no wheezes, rales or rhonchi, good air movement  CV: HRRR, no clubbing cyanosis or  peripheral edema nl cap refill   MS: moves all extremities without noticeable focal  abnormality  PSYCH: pleasant and cooperative, no obvious depression or anxiety  ASSESSMENT AND PLAN:  Discussed the following assessment and plan:  1. Sore throat  POC Rapid Strep A, Culture, Group A Strep  2. Exposure to strep throat  Culture, Group A Strep  3. Tobacco use    4. URI, acute     high-risk exposures certainly this could be viral but because of the severity of her symptoms we'll send a throat culture and begin medicine empirically. Discussed risk of the cephalosporin I doubt if she will have any significant side effect of this can begin pending for culture. Expectant management note for work. Continued efforts for tobacco cessation.  -Patient advised to return or notify health care team  if symptoms worsen or persist or new concerns  arise.  Patient Instructions  Because of  Risk   Of   exposure to strep germ we will begin you on Keflex twice a day for 10 days. There is a theoretical cross-reaction with the penicillin but I do not think her risk is high getting a reaction. Contact us if you get a rash or any side effects. Will inform you of culture results when they are available.  Continue efforts to stop smoking  It is possible that this illness is a viral respiratory infection in which case you will develop cough and body aches and will have to run its course over the next 10-14 days.  Antibiotics usually help strep infections get better in a few days.   Strep Throat Strep throat is an infection of the throat caused by a bacteria named Streptococcus pyogenes. Your caregiver may call the infection streptococcal "tonsillitis" or "pharyngitis" depending on whether there are signs of inflammation in the tonsils or back of the throat. Strep throat is most common in children from 84 to 81 years old during the cold months of the year, but it can occur in people of any age during any season. This infection is spread from person to person (contagious) through coughing, sneezing, or other close contact. SYMPTOMS   Fever or chills.  Painful, swollen, red tonsils or throat.  Pain or difficulty when swallowing.  White or yellow spots on the tonsils or throat.  Swollen, tender lymph nodes or "glands" of the neck or under the jaw.  Red rash all over the body (rare). DIAGNOSIS  Many different infections can cause the same symptoms. A test must be done to confirm the diagnosis so the right treatment can be given. A "rapid strep test" can help your caregiver make the diagnosis in a few minutes. If this test is not available, a light swab of the infected area can be used for a throat culture test. If a throat culture test is done, results are usually available in a day or two. TREATMENT  Strep throat is treated with antibiotic  medicine. HOME CARE INSTRUCTIONS   Gargle with 1 tsp of salt in 1 cup of warm water, 3 to 4 times per day or as needed for comfort.  Family members who also have a sore throat or fever should be tested for strep throat and treated with antibiotics if they have the strep infection.  Make sure everyone in your household washes their hands well.  Do not share food, drinking cups, or personal items that could cause the infection to spread to others.  You  may need to eat a soft food diet until your sore throat gets better.  Drink enough water and fluids to keep your urine clear or pale yellow. This will help prevent dehydration.  Get plenty of rest.  Stay home from school, daycare, or work until you have been on antibiotics for 24 hours.  Only take over-the-counter or prescription medicines for pain, discomfort, or fever as directed by your caregiver.  If antibiotics are prescribed, take them as directed. Finish them even if you start to feel better. SEEK MEDICAL CARE IF:   The glands in your neck continue to enlarge.  You develop a rash, cough, or earache.  You cough up green, yellow-brown, or bloody sputum.  You have pain or discomfort not controlled by medicines.  Your problems seem to be getting worse rather than better. SEEK IMMEDIATE MEDICAL CARE IF:   You develop any new symptoms such as vomiting, severe headache, stiff or painful neck, chest pain, shortness of breath, or trouble swallowing.  You develop severe throat pain, drooling, or changes in your voice.  You develop swelling of the neck, or the skin on the neck becomes red and tender.  You have a fever.  You develop signs of dehydration, such as fatigue, dry mouth, and decreased urination.  You become increasingly sleepy, or you cannot wake up completely. Document Released: 04/10/2000 Document Revised: 07/06/2011 Document Reviewed: 06/12/2010 [facility] Patient Information 2013 [place], [place].      Neta Mends.  Hubbard Seldon M.D.

## 2012-06-02 NOTE — Patient Instructions (Addendum)
Because of  Risk   Of   exposure to strep germ we will begin you on Keflex twice a day for 10 days. There is a theoretical cross-reaction with the penicillin but I do not think her risk is high getting a reaction. Contact us if you get a rash or any side effects. Will inform you of culture results when they are available.  Continue efforts to stop smoking  It is possible that this illness is a viral respiratory infection in which case you will develop cough and body aches and will have to run its course over the next 10-14 days.  Antibiotics usually help strep infections get better in a few days.   Strep Throat Strep throat is an infection of the throat caused by a bacteria named Streptococcus pyogenes. Your caregiver may call the infection streptococcal "tonsillitis" or "pharyngitis" depending on whether there are signs of inflammation in the tonsils or back of the throat. Strep throat is most common in children from 41 to 36 years old during the cold months of the year, but it can occur in people of any age during any season. This infection is spread from person to person (contagious) through coughing, sneezing, or other close contact. SYMPTOMS   Fever or chills.  Painful, swollen, red tonsils or throat.  Pain or difficulty when swallowing.  White or yellow spots on the tonsils or throat.  Swollen, tender lymph nodes or "glands" of the neck or under the jaw.  Red rash all over the body (rare). DIAGNOSIS  Many different infections can cause the same symptoms. A test must be done to confirm the diagnosis so the right treatment can be given. A "rapid strep test" can help your caregiver make the diagnosis in a few minutes. If this test is not available, a light swab of the infected area can be used for a throat culture test. If a throat culture test is done, results are usually available in a day or two. TREATMENT  Strep throat is treated with antibiotic medicine. HOME CARE INSTRUCTIONS    Gargle with 1 tsp of salt in 1 cup of warm water, 3 to 4 times per day or as needed for comfort.  Family members who also have a sore throat or fever should be tested for strep throat and treated with antibiotics if they have the strep infection.  Make sure everyone in your household washes their hands well.  Do not share food, drinking cups, or personal items that could cause the infection to spread to others.  You may need to eat a soft food diet until your sore throat gets better.  Drink enough water and fluids to keep your urine clear or pale yellow. This will help prevent dehydration.  Get plenty of rest.  Stay home from school, daycare, or work until you have been on antibiotics for 24 hours.  Only take over-the-counter or prescription medicines for pain, discomfort, or fever as directed by your caregiver.  If antibiotics are prescribed, take them as directed. Finish them even if you start to feel better. SEEK MEDICAL CARE IF:   The glands in your neck continue to enlarge.  You develop a rash, cough, or earache.  You cough up green, yellow-brown, or bloody sputum.  You have pain or discomfort not controlled by medicines.  Your problems seem to be getting worse rather than better. SEEK IMMEDIATE MEDICAL CARE IF:   You develop any new symptoms such as vomiting, severe headache, stiff or painful neck,  chest pain, shortness of breath, or trouble swallowing.  You develop severe throat pain, drooling, or changes in your voice.  You develop swelling of the neck, or the skin on the neck becomes red and tender.  You have a fever.  You develop signs of dehydration, such as fatigue, dry mouth, and decreased urination.  You become increasingly sleepy, or you cannot wake up completely. Document Released: 04/10/2000 Document Revised: 07/06/2011 Document Reviewed: 06/12/2010 [facility] Patient Information 2013 [place], [place].

## 2012-06-04 LAB — CULTURE, GROUP A STREP

## 2012-06-06 ENCOUNTER — Encounter (INDEPENDENT_AMBULATORY_CARE_PROVIDER_SITE_OTHER): Payer: 59 | Admitting: Surgery

## 2012-06-08 ENCOUNTER — Encounter (INDEPENDENT_AMBULATORY_CARE_PROVIDER_SITE_OTHER): Payer: Self-pay | Admitting: Surgery

## 2012-07-27 ENCOUNTER — Telehealth: Payer: Self-pay | Admitting: Family Medicine

## 2012-07-27 MED ORDER — AMPHETAMINE-DEXTROAMPHET ER 20 MG PO CP24: 20.0000 mg | ORAL_CAPSULE | Freq: Two times a day (BID) | ORAL | Status: DC

## 2012-07-27 NOTE — Telephone Encounter (Signed)
Script is ready for pick up and I spoke with pt.  

## 2012-07-27 NOTE — Telephone Encounter (Signed)
Pt needs refill of amphetamine-dextroamphetamine (ADDERALL XR) 20 MG 24 hr capsule.

## 2012-07-27 NOTE — Telephone Encounter (Signed)
done

## 2012-10-24 ENCOUNTER — Telehealth: Payer: Self-pay | Admitting: Family Medicine

## 2012-10-24 NOTE — Telephone Encounter (Signed)
Pt needs refill of amphetamine-dextroamphetamine (ADDERALL XR) 20 MG 24 hr capsule 1 po bid

## 2012-10-25 MED ORDER — AMPHETAMINE-DEXTROAMPHET ER 20 MG PO CP24: 20.0000 mg | ORAL_CAPSULE | Freq: Two times a day (BID) | ORAL | Status: DC

## 2012-10-25 NOTE — Telephone Encounter (Signed)
done

## 2012-11-15 ENCOUNTER — Other Ambulatory Visit: Payer: Self-pay | Admitting: Family Medicine

## 2012-11-15 NOTE — Telephone Encounter (Signed)
Call in #60 with 5 rf 

## 2012-12-20 ENCOUNTER — Ambulatory Visit (INDEPENDENT_AMBULATORY_CARE_PROVIDER_SITE_OTHER): Payer: 59 | Admitting: Family Medicine

## 2012-12-20 ENCOUNTER — Encounter: Payer: Self-pay | Admitting: Family Medicine

## 2012-12-20 VITALS — BP 120/80 | Temp 98.6°F | Wt 214.0 lb

## 2012-12-20 DIAGNOSIS — IMO0002 Reserved for concepts with insufficient information to code with codable children: Secondary | ICD-10-CM

## 2012-12-20 DIAGNOSIS — T148XXA Other injury of unspecified body region, initial encounter: Secondary | ICD-10-CM

## 2012-12-20 MED ORDER — DOXYCYCLINE HYCLATE 100 MG PO CAPS: 100.0000 mg | ORAL_CAPSULE | Freq: Two times a day (BID) | ORAL | Status: DC

## 2012-12-20 MED ORDER — MUPIROCIN 2 % EX OINT: TOPICAL_OINTMENT | Freq: Three times a day (TID) | CUTANEOUS | Status: DC

## 2012-12-20 NOTE — Patient Instructions (Addendum)
-  keep wound clean and dry  -ointment 2 times daily  -antibiotic as instructed  -see a doctor immediately if swelling, redness, fevers, etc

## 2012-12-20 NOTE — Progress Notes (Signed)
Chief Complaint  Patient presents with  . finger laceration    HPI:  Acute visit for cut on finger: -occurred yesterday on R middle finger print area -cut on clean metal drawr at home -had tdap in 2010 -regular periods, fdlmp Dec 03 2012, s/p bliat tubal ligation -allergic to sulfa and penicillins -denies: swelling, drainage, fevers, malaise  ROS: See pertinent positives and negatives per HPI.  Past Medical History  Diagnosis Date  . Anemia   . Depression   . Anxiety   . Migraine headache   . ADHD (attention deficit hyperactivity disorder)   . Abdominal pain   . Gum disease   . Gallstones     Past Surgical History  Procedure Laterality Date  . Cesarean section      x 2    Family History  Problem Relation Age of Onset  . Cancer      breast/fhx  . Depression      fhx    History   Social History  . Marital Status: Single    Spouse Name: N/A    Number of Children: N/A  . Years of Education: N/A   Occupational History  . 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 Smoking status: Current Every Day Smoker -- 0.50 packs/day    Types: Cigarettes  . Smokeless tobacco: Never Used  . Alcohol Use: Yes     Comment: once every other month  . Drug Use: No  . Sexual Activity: None   Other Topics Concern  . None   Social History Narrative  . None    Current outpatient prescriptions:ALPRAZolam (XANAX) 1 MG tablet, TAKE 1 TABLET BY MOUTH TWICE A DAY, Disp: 60 tablet, Rfl: 5;  amphetamine-dextroamphetamine (ADDERALL XR) 20 MG 24 hr capsule, Take 1 capsule (20 mg total) by mouth 2 (two) times daily., Disp: 60 capsule, Rfl: 0;  cephALEXin (KEFLEX) 500 MG capsule, Take 1 capsule (500 mg total) by mouth 2 (two) times daily., Disp: 20 capsule, Rfl: 0 citalopram (CELEXA) 40 MG tablet, TAKE 1 TABLET BY MOUTH EVERY DAY, Disp: 60 tablet, Rfl: 5;  clindamycin (CLEOCIN) 300 MG capsule, 4 times daily., Disp: , Rfl: ;  SUMAtriptan (IMITREX) 100 MG tablet, TAKE 1  TABLET BY MOUTH ONCE AS NEEDED FOR MIGRAINE., Disp: 12 tablet, Rfl: 11;  doxycycline (VIBRAMYCIN) 100 MG capsule, Take 1 capsule (100 mg total) by mouth 2 (two) times daily., Disp: 14 capsule, Rfl: 0 mupirocin ointment (BACTROBAN) 2 %, Apply topically 3 (three) times daily., Disp: 22 g, Rfl: 0  EXAM:  Filed Vitals:   12/20/12 1528  BP: 120/80  Temp: 98.6 F (37 C)    Body mass index is 33.51 kg/(m^2).  GENERAL: vitals reviewed and listed above, alert, oriented, appears well hydrated and in no acute distress  HEENT: atraumatic, conjunttiva clear, no obvious abnormalities on inspection of external nose and ears  NECK: no obvious masses on inspection  SKIN: superficial lac on R middle finger pad - no signs of infection - tissue flap appears likely non-viable  MS: moves all extremities without noticeable abnormality  PSYCH: pleasant and cooperative, no obvious depression or anxiety  ASSESSMENT AND PLAN:  Discussed the following assessment and plan:  Skin laceration - Plan: mupirocin ointment (BACTROBAN) 2 %, doxycycline (VIBRAMYCIN) 100 MG capsule -advised numbing, debridement of dead tissue - she refused this, cleaned with copious amounts of sterile saline and abx ointment and dressing applied -abx to prevent infection - risks  and return precuaitons -UTD on tetanus -Patient advised to return or notify a doctor immediately if symptoms worsen or persist or new concerns arise.  Patient Instructions  -keep wound clean and dry  -ointment 2 times daily  -antibiotic as instructed  -see a doctor immediately if swelling, redness, fevers, etc       KIM, HANNAH R.

## 2012-12-28 ENCOUNTER — Ambulatory Visit: Payer: 59 | Admitting: Family Medicine

## 2012-12-28 DIAGNOSIS — Z0289 Encounter for other administrative examinations: Secondary | ICD-10-CM

## 2013-01-02 ENCOUNTER — Telehealth: Payer: Self-pay | Admitting: Family Medicine

## 2013-01-02 NOTE — Telephone Encounter (Signed)
Pt following up on request for prior auth for amphetamine-dextroamphetamine (ADDERALL XR) 20 MG 24 hr 1/ BID Pt is out of med.  [facility]

## 2013-01-02 NOTE — Telephone Encounter (Signed)
Resent my prior auth request to [facility] again today, as I have no response yet.

## 2013-01-19 ENCOUNTER — Telehealth: Payer: Self-pay | Admitting: Family Medicine

## 2013-01-19 NOTE — Telephone Encounter (Signed)
Pt needs new rx generic adderall xr 20 mg twice a day#60

## 2013-01-20 MED ORDER — AMPHETAMINE-DEXTROAMPHET ER 20 MG PO CP24: 20.0000 mg | ORAL_CAPSULE | Freq: Two times a day (BID) | ORAL | Status: DC

## 2013-01-20 NOTE — Telephone Encounter (Signed)
done

## 2013-01-23 NOTE — Telephone Encounter (Signed)
Script is ready for pick up and I spoke with pt.  

## 2013-03-27 ENCOUNTER — Other Ambulatory Visit: Payer: Self-pay | Admitting: Family Medicine

## 2013-04-28 ENCOUNTER — Ambulatory Visit: Payer: 59 | Admitting: Family Medicine

## 2013-04-28 DIAGNOSIS — Z0289 Encounter for other administrative examinations: Secondary | ICD-10-CM

## 2013-05-05 ENCOUNTER — Other Ambulatory Visit: Payer: Self-pay | Admitting: Family Medicine

## 2013-05-05 ENCOUNTER — Telehealth: Payer: Self-pay | Admitting: Family Medicine

## 2013-05-05 MED ORDER — AMPHETAMINE-DEXTROAMPHET ER 20 MG PO CP24: 20.0000 mg | ORAL_CAPSULE | Freq: Two times a day (BID) | ORAL | Status: DC

## 2013-05-05 NOTE — Telephone Encounter (Signed)
Done for one month only. She needs an OV and to be tested

## 2013-05-05 NOTE — Telephone Encounter (Signed)
Pt needs new rx generic adderall xr 20 mg °

## 2013-05-05 NOTE — Telephone Encounter (Signed)
Call in #60 Xanax with no refills, also Imitrex #12 with no refills. She needs an OV soon

## 2013-05-08 NOTE — Telephone Encounter (Signed)
Scrip is ready for pick up, contract printed and I spoke with pt.

## 2013-05-26 ENCOUNTER — Ambulatory Visit (INDEPENDENT_AMBULATORY_CARE_PROVIDER_SITE_OTHER): Payer: 59 | Admitting: Family Medicine

## 2013-05-26 ENCOUNTER — Encounter: Payer: Self-pay | Admitting: Family Medicine

## 2013-05-26 VITALS — BP 114/64 | HR 122 | Temp 98.0°F | Ht 67.0 in | Wt 206.0 lb

## 2013-05-26 DIAGNOSIS — F909 Attention-deficit hyperactivity disorder, unspecified type: Secondary | ICD-10-CM

## 2013-05-26 DIAGNOSIS — F411 Generalized anxiety disorder: Secondary | ICD-10-CM

## 2013-05-26 DIAGNOSIS — D509 Iron deficiency anemia, unspecified: Secondary | ICD-10-CM

## 2013-05-26 LAB — BASIC METABOLIC PANEL
BUN: 6 mg/dL (ref 6–23)
CHLORIDE: 105 meq/L (ref 96–112)
CO2: 29 mEq/L (ref 19–32)
Calcium: 9.4 mg/dL (ref 8.4–10.5)
Creat: 0.65 mg/dL (ref 0.50–1.10)
GLUCOSE: 91 mg/dL (ref 70–99)
POTASSIUM: 5 meq/L (ref 3.5–5.3)
Sodium: 139 mEq/L (ref 135–145)

## 2013-05-26 LAB — HEPATIC FUNCTION PANEL
ALBUMIN: 4.2 g/dL (ref 3.5–5.2)
ALT: 17 U/L (ref 0–35)
AST: 19 U/L (ref 0–37)
Alkaline Phosphatase: 68 U/L (ref 39–117)
Bilirubin, Direct: 0.1 mg/dL (ref 0.0–0.3)
Indirect Bilirubin: 0.2 mg/dL (ref 0.2–1.2)
Total Bilirubin: 0.3 mg/dL (ref 0.2–1.2)
Total Protein: 6.8 g/dL (ref 6.0–8.3)

## 2013-05-26 MED ORDER — AMPHETAMINE-DEXTROAMPHET ER 20 MG PO CP24: 20.0000 mg | ORAL_CAPSULE | Freq: Two times a day (BID) | ORAL | Status: DC

## 2013-05-26 MED ORDER — ALPRAZOLAM 1 MG PO TABS: ORAL_TABLET | ORAL | Status: DC

## 2013-05-26 MED ORDER — SUMATRIPTAN SUCCINATE 100 MG PO TABS: ORAL_TABLET | ORAL | Status: DC

## 2013-05-26 NOTE — Progress Notes (Signed)
Pre visit review using our clinic review tool, if applicable. No additional management support is needed unless otherwise documented below in the visit note. 

## 2013-05-27 LAB — CBC WITH DIFFERENTIAL/PLATELET
BASOS ABS: 0 10*3/uL (ref 0.0–0.1)
BASOS PCT: 1 % (ref 0–1)
EOS ABS: 0.3 10*3/uL (ref 0.0–0.7)
Eosinophils Relative: 3 % (ref 0–5)
HCT: 33.6 % — ABNORMAL LOW (ref 36.0–46.0)
Hemoglobin: 10 g/dL — ABNORMAL LOW (ref 12.0–15.0)
Lymphocytes Relative: 27 % (ref 12–46)
Lymphs Abs: 2.3 10*3/uL (ref 0.7–4.0)
MCH: 19.8 pg — ABNORMAL LOW (ref 26.0–34.0)
MCHC: 29.8 g/dL — AB (ref 30.0–36.0)
MCV: 66.4 fL — ABNORMAL LOW (ref 78.0–100.0)
Monocytes Absolute: 0.5 10*3/uL (ref 0.1–1.0)
Monocytes Relative: 6 % (ref 3–12)
NEUTROS PCT: 63 % (ref 43–77)
Neutro Abs: 5.5 10*3/uL (ref 1.7–7.7)
Platelets: 458 10*3/uL — ABNORMAL HIGH (ref 150–400)
RBC: 5.06 MIL/uL (ref 3.87–5.11)
RDW: 17.3 % — AB (ref 11.5–15.5)
WBC: 8.6 10*3/uL (ref 4.0–10.5)

## 2013-05-27 LAB — TSH: TSH: 1.06 u[IU]/mL (ref 0.350–4.500)

## 2013-05-29 ENCOUNTER — Encounter: Payer: Self-pay | Admitting: Family Medicine

## 2013-05-29 DIAGNOSIS — F909 Attention-deficit hyperactivity disorder, unspecified type: Secondary | ICD-10-CM | POA: Insufficient documentation

## 2013-05-29 NOTE — Progress Notes (Signed)
   Subjective:    Patient ID: Sheila [name], female    DOB: 09/08/1980, 33 y.o.   MRN: [id number]  HPI Here to follow up. She is doing well and her medications work well for her.    Review of Systems  Constitutional: Negative.   Respiratory: Negative.   Cardiovascular: Negative.   Neurological: Negative.   Psychiatric/Behavioral: Negative.        Objective:   Physical Exam  Constitutional: She is oriented to person, place, and time. She appears well-developed and well-nourished.  Cardiovascular: Normal rate, regular rhythm, normal heart sounds and intact distal pulses.   Pulmonary/Chest: Effort normal and breath sounds normal.  Neurological: She is alert and oriented to person, place, and time.  Psychiatric: She has a normal mood and affect. Her behavior is normal. Thought content normal.          Assessment & Plan:  She is doing well. Meds were refilled.

## 2013-05-30 MED ORDER — FERROUS SULFATE 325 (65 FE) MG PO TABS: 325.0000 mg | ORAL_TABLET | Freq: Two times a day (BID) | ORAL | Status: DC

## 2013-05-30 NOTE — Addendum Note (Signed)
Addended by: Aniceto BossNIMMONS, Alecsander Hattabaugh A on: 05/30/2013 01:32 PM   Modules accepted: Orders

## 2013-05-31 ENCOUNTER — Telehealth: Payer: Self-pay | Admitting: Family Medicine

## 2013-05-31 NOTE — Telephone Encounter (Signed)
Relevant patient education assigned to patient using Emmi. ° °

## 2013-06-08 ENCOUNTER — Encounter: Payer: Self-pay | Admitting: Family Medicine

## 2013-07-05 ENCOUNTER — Encounter: Payer: Self-pay | Admitting: Family Medicine

## 2013-07-05 ENCOUNTER — Telehealth: Payer: Self-pay | Admitting: Family Medicine

## 2013-07-05 ENCOUNTER — Ambulatory Visit (INDEPENDENT_AMBULATORY_CARE_PROVIDER_SITE_OTHER): Payer: 59 | Admitting: Family Medicine

## 2013-07-05 VITALS — BP 100/66 | HR 113 | Temp 98.8°F | Ht 67.0 in | Wt 204.0 lb

## 2013-07-05 DIAGNOSIS — L42 Pityriasis rosea: Secondary | ICD-10-CM

## 2013-07-05 MED ORDER — METHYLPREDNISOLONE ACETATE 80 MG/ML IJ SUSP
120.0000 mg | Freq: Once | INTRAMUSCULAR | Status: AC
  Administered 2013-07-05: 120 mg via INTRAMUSCULAR

## 2013-07-05 MED ORDER — AZITHROMYCIN 250 MG PO TABS: ORAL_TABLET | ORAL | Status: DC

## 2013-07-05 NOTE — Telephone Encounter (Signed)
Pt was seen today and now has low grade fever and body aches. [facility] [address]

## 2013-07-05 NOTE — Progress Notes (Signed)
Pre visit review using our clinic review tool, if applicable. No additional management support is needed unless otherwise documented below in the visit note. 

## 2013-07-05 NOTE — Telephone Encounter (Signed)
Call in a Zpack  ?

## 2013-07-05 NOTE — Telephone Encounter (Signed)
Relevant patient education assigned to patient using Emmi. ° °

## 2013-07-05 NOTE — Telephone Encounter (Signed)
I sent script e-scribe and spoke with pt. 

## 2013-07-05 NOTE — Progress Notes (Signed)
   Subjective:    Patient ID: Meriel FlavorsShannon L Buendia, female    DOB: 09/14/1980, 33 y.o.   MRN: [id number]  HPI Here for one week of an itchy rash that started on her abdomen and has spread to her back and upper arms. No other sx.    Review of Systems  Constitutional: Negative.   HENT: Negative.   Eyes: Negative.   Respiratory: Negative.   Skin: Positive for rash.       Objective:   Physical Exam  Constitutional: She appears well-developed and well-nourished. No distress.  HENT:  Right Ear: External ear normal.  Left Ear: External ear normal.  Nose: Nose normal.  Mouth/Throat: Oropharynx is clear and moist.  Eyes: Conjunctivae are normal.  Neck: No thyromegaly present.  Pulmonary/Chest: Effort normal and breath sounds normal.  Lymphadenopathy:    She has no cervical adenopathy.  Skin:  Widespread red maculopapular rash over the trunk and arms          Assessment & Plan:  Given a steroid shot. Add Benadryl every 4 hours prn and soak in an oatmeal bath. Out of work yesterday and today.

## 2013-07-05 NOTE — Addendum Note (Signed)
Addended by: Aniceto BossNIMMONS, Westyn Driggers A on: 07/05/2013 12:27 PM   Modules accepted: Orders

## 2013-07-06 ENCOUNTER — Telehealth: Payer: Self-pay | Admitting: Family Medicine

## 2013-07-06 ENCOUNTER — Encounter (HOSPITAL_COMMUNITY): Payer: Self-pay | Admitting: Emergency Medicine

## 2013-07-06 ENCOUNTER — Emergency Department (HOSPITAL_COMMUNITY)
Admission: EM | Admit: 2013-07-06 | Discharge: 2013-07-06 | Disposition: A | Discharge status: Home / Self Care | Payer: 59 | Source: Home / Self Care | Attending: Family Medicine | Admitting: Family Medicine

## 2013-07-06 DIAGNOSIS — R21 Rash and other nonspecific skin eruption: Secondary | ICD-10-CM

## 2013-07-06 LAB — BASIC METABOLIC PANEL
BUN: 7 mg/dL (ref 6–23)
CHLORIDE: 100 meq/L (ref 96–112)
CO2: 25 mEq/L (ref 19–32)
CREATININE: 0.72 mg/dL (ref 0.50–1.10)
Calcium: 9 mg/dL (ref 8.4–10.5)
GFR calc Af Amer: >90 mL/min (ref >90)
Glucose, Bld: 97 mg/dL (ref 70–99)
POTASSIUM: 4.1 meq/L (ref 3.7–5.3)
Sodium: 139 mEq/L (ref 137–147)

## 2013-07-06 LAB — CBC
HCT: 38.5 % (ref 36.0–46.0)
Hemoglobin: 12.1 g/dL (ref 12.0–15.0)
MCH: 22 pg — ABNORMAL LOW (ref 26.0–34.0)
MCHC: 31.4 g/dL (ref 30.0–36.0)
MCV: 69.9 fL — AB (ref 78.0–100.0)
Platelets: 293 10*3/uL (ref 150–400)
RBC: 5.51 MIL/uL — AB (ref 3.87–5.11)
RDW: 20.7 % — AB (ref 11.5–15.5)
WBC: 4.1 10*3/uL (ref 4.0–10.5)

## 2013-07-06 LAB — SEDIMENTATION RATE: SED RATE: 13 mm/h (ref 0–22)

## 2013-07-06 MED ORDER — PREDNISONE 10 MG PO KIT: PACK | ORAL | Status: DC

## 2013-07-06 NOTE — ED Notes (Signed)
Patient states she has had a rash for 1 week and body aches 1 month, she saw Dr. Fry about yesterday, she states he gave her a steroid injection, but rash got worse with body aches today; states fever/chills last night. Denies n/v. 

## 2013-07-06 NOTE — ED Provider Notes (Signed)
Sheila [name] is a 33 y.o. female who presents to Urgent Care today for rash. Patient has had a rash for about one week. She was seen at her primary care office yesterday and received a steroid injection which helped temporarily. She's had body aches off and on for about one month as well. She denies any fevers or chills cough congestion runny nose nausea vomiting or diarrhea. She adamantly denies any medication use recently. Her provider also called in a azithromycin antibiotic course which she's had 2 doses of starting yesterday. She denies any personal or family history of autoimmune conditions. She denies any mouth or eye involvement. No new soaps or detergents etc.   Past Medical History  Diagnosis Date  . Anemia   . Depression   . Anxiety   . Migraine headache   . ADHD (attention deficit hyperactivity disorder)   . Abdominal pain   . Gum disease   . Gallstones    History  Substance Use Topics  . Smoking status: Current Every Day Smoker -- 0.50 packs/day    Types: Cigarettes  . Smokeless tobacco: Never Used  . Alcohol Use: Yes     Comment: once every other month   ROS as above Medications: No current facility-administered medications for this encounter.   Current Outpatient Prescriptions  Medication Sig Dispense Refill  . ALPRAZolam (XANAX) 1 MG tablet TAKE 1 TABLET BY MOUTH TWICE A DAY  60 tablet  5  . amphetamine-dextroamphetamine (ADDERALL XR) 20 MG 24 hr capsule Take 1 capsule (20 mg total) by mouth 2 (two) times daily.  60 capsule  0  . SUMAtriptan (IMITREX) 100 MG tablet TAKE 1 TABLET BY MOUTH ONCE DAILY AS NEEDED FOR MIGRAINE  12 tablet  11  . ferrous sulfate 325 (65 FE) MG tablet Take 325 mg by mouth daily with breakfast.      . PredniSONE 10 MG KIT 12 day dose pack po  1 kit  0    Exam:  BP 99/62  Pulse 87  Temp(Src) 97.5 F (36.4 C) (Oral)  Resp 16  SpO2 100%  LMP 06/17/2013 Gen: Well NAD nontoxic appearing HEENT: EOMI,  MMM Lungs: Normal work of  breathing. CTABL Heart: RRR no MRG Abd: NABS, Soft. NT, ND Exts: Brisk capillary refill, warm and well perfused.  Skin: Diffuse sandpapery erythematous rash across trunk and extremities. Nontender. Blanching.  No results found for this or any previous visit (from the past 24 hour(s)). No results found.  Assessment and Plan: 33 y.o. female with rash. Unclear etiology. Doubtful for drug rashes no medication use recently. Plan for prednisone dosepack course. Followup if not improving.  CBC, BMP, ESR pending.   Discussed warning signs or symptoms. Please see discharge instructions. Patient expresses understanding.    Gregor Hams, MD 07/06/13 [phone]

## 2013-07-06 NOTE — Discharge Instructions (Signed)
Thank you for coming in today. Take prednisone daily for 12 days I will call you if the labs are very abnormal Come back as needed

## 2013-07-06 NOTE — Telephone Encounter (Signed)
Patient Information:  Caller Name: Carollee HerterShannon  Phone: [phone]  Patient: Sheila [name], Sheila [name]  Gender: Female  DOB: 06/13/1980  Age: [age] Years  PCP: Gershon CraneFry, [facility])  Pregnant: No  Office Follow Up:  Does the office need to follow up with this patient?: Yes  Instructions For The Office: Patient needs further instructions.  RN Note:  Patient has been taking Z-Pack as prescribed, but reports severe itching still present and now has redness in some areas of the rash. She would like to know if she needs to be seen again or if MD needs to change medication she is currently taking. Please contact her with further instructions.  Symptoms  Reason For Call & Symptoms: Body aches, tingling. Was seen in office on 07/05/13 for rash. Was instructed to call back if symptoms changed. Reports generalized body aches, redness/itching to chest, abdomen, legs, arms. Was diagnosed with rosacea in office and given injection of steroid.  Reviewed Health History In EMR: Yes  Reviewed Medications In EMR: Yes  Reviewed Allergies In EMR: Yes  Reviewed Surgeries / Procedures: Yes  Date of Onset of Symptoms: 07/06/2013  Treatments Tried: Z-Pack  Treatments Tried Worked: No OB / GYN:  LMP: 06/15/2013  Guideline(s) Used:  Rash or Redness - Widespread  Disposition Per Guideline:   See Today in Office  Reason For Disposition Reached:   Severe itching  Advice Given:  N/A  Patient Refused Recommendation:  Patient Refused Care Advice  Would like further instructions since she was seen in office yesterday.

## 2013-07-06 NOTE — Telephone Encounter (Signed)
I spoke with pt and she is going to try and schedule with another provider here today, if she can't get in, then is going to a Urgent Care, pt is aware that Dr. Clent RidgesFry is out of the office today.

## 2013-07-11 ENCOUNTER — Other Ambulatory Visit: Payer: Self-pay | Admitting: Obstetrics & Gynecology

## 2013-07-18 ENCOUNTER — Telehealth: Payer: Self-pay | Admitting: Family Medicine

## 2013-07-18 ENCOUNTER — Other Ambulatory Visit: Payer: Self-pay | Admitting: Family Medicine

## 2013-07-18 NOTE — Telephone Encounter (Signed)
Pt would like to know if you would call in rx chantix for [facility] [address] Pt aware dr out until thursday

## 2013-07-20 MED ORDER — VARENICLINE TARTRATE 1 MG PO TABS: 1.0000 mg | ORAL_TABLET | Freq: Two times a day (BID) | ORAL | Status: DC

## 2013-07-20 MED ORDER — VARENICLINE TARTRATE 0.5 MG X 11 & 1 MG X 42 PO MISC: ORAL | Status: DC

## 2013-07-20 NOTE — Telephone Encounter (Signed)
[facility] and I spoke to pt.

## 2013-07-20 NOTE — Telephone Encounter (Signed)
Call in Chantix one starting pack and 2 continuing packs

## 2013-08-19 ENCOUNTER — Other Ambulatory Visit: Payer: Self-pay | Admitting: Family Medicine

## 2013-08-24 ENCOUNTER — Other Ambulatory Visit: Payer: Self-pay | Admitting: Obstetrics & Gynecology

## 2013-08-28 ENCOUNTER — Telehealth: Payer: Self-pay | Admitting: Family Medicine

## 2013-08-28 MED ORDER — AMPHETAMINE-DEXTROAMPHET ER 20 MG PO CP24: 20.0000 mg | ORAL_CAPSULE | Freq: Two times a day (BID) | ORAL | Status: DC

## 2013-08-28 NOTE — Telephone Encounter (Signed)
done

## 2013-08-28 NOTE — Telephone Encounter (Signed)
Pt need new rx generic adderall xr 20 mg

## 2013-08-29 NOTE — Telephone Encounter (Signed)
Script is ready for pick up and I spoke with pt.  

## 2013-09-04 ENCOUNTER — Telehealth: Payer: Self-pay | Admitting: Family Medicine

## 2013-09-04 NOTE — Telephone Encounter (Signed)
Pt is wanting to know if dr. Clent RidgesFry has any coupons for rx CHANTIX STARTING MONTH PAK 0.5 MG X 11 & 1 MG X 42 tablet, pt states the pharmacy informed her to check with her pcp because the medication is so expensive.

## 2013-09-05 NOTE — Telephone Encounter (Signed)
Yes, pt aware its up front for p/u

## 2013-11-07 ENCOUNTER — Other Ambulatory Visit: Payer: Self-pay | Admitting: Obstetrics & Gynecology

## 2013-11-30 ENCOUNTER — Other Ambulatory Visit: Payer: Self-pay | Admitting: Family Medicine

## 2013-11-30 ENCOUNTER — Telehealth: Payer: Self-pay | Admitting: Family Medicine

## 2013-11-30 NOTE — Telephone Encounter (Signed)
Pt request refill of the following: amphetamine-dextroamphetamine (ADDERALL XR) 20 MG 24 hr capsule    Phamacy:   Pick up

## 2013-12-01 MED ORDER — AMPHETAMINE-DEXTROAMPHET ER 20 MG PO CP24: 20.0000 mg | ORAL_CAPSULE | Freq: Two times a day (BID) | ORAL | Status: DC

## 2013-12-01 NOTE — Telephone Encounter (Signed)
done

## 2013-12-01 NOTE — Telephone Encounter (Signed)
Call in #60 with 5 rf 

## 2013-12-01 NOTE — Telephone Encounter (Signed)
Script is ready for pick up and I spoke with pt.  

## 2014-02-02 ENCOUNTER — Ambulatory Visit (INDEPENDENT_AMBULATORY_CARE_PROVIDER_SITE_OTHER): Payer: 59 | Admitting: Family Medicine

## 2014-02-02 ENCOUNTER — Encounter: Payer: Self-pay | Admitting: Family Medicine

## 2014-02-02 VITALS — BP 105/72 | HR 82 | Temp 98.3°F | Ht 67.0 in | Wt 205.0 lb

## 2014-02-02 DIAGNOSIS — R1013 Epigastric pain: Secondary | ICD-10-CM

## 2014-02-02 MED ORDER — OMEPRAZOLE 40 MG PO CPDR: 40.0000 mg | DELAYED_RELEASE_CAPSULE | Freq: Every day | ORAL | Status: DC

## 2014-02-02 NOTE — Progress Notes (Signed)
   Subjective:    Patient ID: Sheila [name], female    DOB: 12/20/1980, 33 y.o.   MRN: [id number]  HPI Here for one week of epigastric pain. This waxes and wanes but is present constantly. Eating food seems to make it worse. No fever or back pain or nausea. No urinary or bowel problems. She says it feels exactly like it di before she had her gall bladder removed.    Review of Systems  Constitutional: Negative.   Respiratory: Negative.   Cardiovascular: Negative.   Gastrointestinal: Positive for abdominal pain. Negative for nausea, vomiting, diarrhea, constipation, blood in stool, abdominal distention, anal bleeding and rectal pain.  Genitourinary: Negative.        Objective:   Physical Exam  Constitutional: She appears well-developed and well-nourished.  Cardiovascular: Normal rate, regular rhythm, normal heart sounds and intact distal pulses.   Pulmonary/Chest: Effort normal and breath sounds normal.  Abdominal: Soft. Bowel sounds are normal. She exhibits no distension and no mass. There is no tenderness. There is no rebound and no guarding.          Assessment & Plan:  This is likely duodenitis or ulcers. Try Omeprazole 40 mg daily. Recheck prn

## 2014-02-02 NOTE — Progress Notes (Signed)
Pre visit review using our clinic review tool, if applicable. No additional management support is needed unless otherwise documented below in the visit note. 

## 2014-02-05 ENCOUNTER — Telehealth: Payer: Self-pay | Admitting: Family Medicine

## 2014-02-05 NOTE — Telephone Encounter (Signed)
emmi emailed °

## 2014-03-07 ENCOUNTER — Telehealth: Payer: Self-pay | Admitting: Family Medicine

## 2014-03-07 MED ORDER — AMPHETAMINE-DEXTROAMPHET ER 20 MG PO CP24
20.0000 mg | ORAL_CAPSULE | Freq: Two times a day (BID) | ORAL | Status: DC
Start: 2014-03-07 — End: 2014-03-07

## 2014-03-07 MED ORDER — AMPHETAMINE-DEXTROAMPHET ER 20 MG PO CP24
20.0000 mg | ORAL_CAPSULE | Freq: Two times a day (BID) | ORAL | Status: DC
Start: 2014-03-07 — End: 2014-06-19

## 2014-03-07 NOTE — Telephone Encounter (Signed)
done

## 2014-03-07 NOTE — Telephone Encounter (Signed)
° ° ° °  Pt request refill of the following: ° ° °amphetamine-dextroamphetamine (ADDERALL XR) 20 MG 24 hr capsule ° ° °Phamacy: °

## 2014-03-08 NOTE — Telephone Encounter (Signed)
Script is ready for pick up and I spoke with pt.  

## 2014-05-25 ENCOUNTER — Other Ambulatory Visit: Payer: Self-pay | Admitting: Family Medicine

## 2014-05-29 NOTE — Telephone Encounter (Signed)
NO, she was given a 6 month supply on 02-02-14

## 2014-05-30 ENCOUNTER — Other Ambulatory Visit: Payer: Self-pay | Admitting: *Deleted

## 2014-05-30 MED ORDER — ALPRAZOLAM 1 MG PO TABS: 1.0000 mg | ORAL_TABLET | Freq: Two times a day (BID) | ORAL | Status: DC | PRN

## 2014-05-30 NOTE — Telephone Encounter (Signed)
Call in #60 with 5 rf 

## 2014-06-18 ENCOUNTER — Telehealth: Payer: Self-pay | Admitting: Family Medicine

## 2014-06-18 NOTE — Telephone Encounter (Signed)
Pt needs new rx generic adderall xr 20 mg,

## 2014-06-19 ENCOUNTER — Telehealth: Payer: Self-pay | Admitting: Family Medicine

## 2014-06-19 MED ORDER — AMPHETAMINE-DEXTROAMPHET ER 20 MG PO CP24: 20.0000 mg | ORAL_CAPSULE | Freq: Two times a day (BID) | ORAL | Status: DC

## 2014-06-19 NOTE — Telephone Encounter (Signed)
Script is ready for pick up and I spoke with pt.  

## 2014-06-19 NOTE — Telephone Encounter (Signed)
Pt has come down with flu and would like tami-flu [facility]

## 2014-06-19 NOTE — Telephone Encounter (Signed)
done

## 2014-06-20 NOTE — Telephone Encounter (Signed)
Per Dr. Clent RidgesFry, we cannot call this in, pt needs to be seen. I spoke with pt.and actually she is feeling better.

## 2014-09-21 ENCOUNTER — Telehealth: Payer: Self-pay | Admitting: Family Medicine

## 2014-09-21 MED ORDER — AMPHETAMINE-DEXTROAMPHET ER 20 MG PO CP24
20.0000 mg | ORAL_CAPSULE | Freq: Two times a day (BID) | ORAL | Status: DC
Start: 2014-09-21 — End: 2015-07-29

## 2014-09-21 MED ORDER — AMPHETAMINE-DEXTROAMPHET ER 20 MG PO CP24: 20.0000 mg | ORAL_CAPSULE | Freq: Two times a day (BID) | ORAL | Status: DC

## 2014-09-21 NOTE — Telephone Encounter (Signed)
Patient is requesting re-fill on amphetamine-dextroamphetamine (ADDERALL XR) 20 MG 24 hr capsule.

## 2014-09-21 NOTE — Telephone Encounter (Signed)
done

## 2014-09-21 NOTE — Telephone Encounter (Signed)
Spoke with pt and pt is aware rx is ready for pick up.  

## 2014-10-24 ENCOUNTER — Other Ambulatory Visit: Payer: Self-pay | Admitting: Family Medicine

## 2015-07-08 ENCOUNTER — Telehealth: Payer: Self-pay | Admitting: Family Medicine

## 2015-07-08 NOTE — Telephone Encounter (Signed)
errror/njr °

## 2015-07-09 ENCOUNTER — Telehealth: Payer: Self-pay | Admitting: Family Medicine

## 2015-07-09 MED ORDER — ALPRAZOLAM 1 MG PO TABS: 1.0000 mg | ORAL_TABLET | Freq: Two times a day (BID) | ORAL | Status: DC | PRN

## 2015-07-09 NOTE — Telephone Encounter (Signed)
Call in #60 with NO refills. She needs an OV soon

## 2015-07-09 NOTE — Telephone Encounter (Signed)
I called in script and spoke with pt, she has appointment here in March 2017.

## 2015-07-09 NOTE — Telephone Encounter (Signed)
Pt request refill of the following: ALPRAZolam (XANAX) 1 MG tablet  ° ° °Phamacy: [facility] [address]   °

## 2015-07-22 ENCOUNTER — Other Ambulatory Visit (INDEPENDENT_AMBULATORY_CARE_PROVIDER_SITE_OTHER): Payer: 59

## 2015-07-22 DIAGNOSIS — Z0000 Encounter for general adult medical examination without abnormal findings: Secondary | ICD-10-CM | POA: Diagnosis not present

## 2015-07-22 LAB — CBC WITH DIFFERENTIAL/PLATELET
Basophils Absolute: 0 10*3/uL (ref 0.0–0.1)
Basophils Relative: 0.4 % (ref 0.0–3.0)
EOS ABS: 0.2 10*3/uL (ref 0.0–0.7)
EOS PCT: 2.2 % (ref 0.0–5.0)
HCT: 37.6 % (ref 36.0–46.0)
Hemoglobin: 12.5 g/dL (ref 12.0–15.0)
Lymphocytes Relative: 18.5 % (ref 12.0–46.0)
Lymphs Abs: 1.9 10*3/uL (ref 0.7–4.0)
MCHC: 33.1 g/dL (ref 30.0–36.0)
MCV: 79.8 fl (ref 78.0–100.0)
MONO ABS: 0.5 10*3/uL (ref 0.1–1.0)
Monocytes Relative: 4.9 % (ref 3.0–12.0)
NEUTROS ABS: 7.5 10*3/uL (ref 1.4–7.7)
Neutrophils Relative %: 74 % (ref 43.0–77.0)
PLATELETS: 365 10*3/uL (ref 150.0–400.0)
RBC: 4.71 Mil/uL (ref 3.87–5.11)
RDW: 14.1 % (ref 11.5–15.5)
WBC: 10.1 10*3/uL (ref 4.0–10.5)

## 2015-07-22 LAB — POC URINALSYSI DIPSTICK (AUTOMATED)
Bilirubin, UA: NEGATIVE
Blood, UA: NEGATIVE
GLUCOSE UA: NEGATIVE
Ketones, UA: NEGATIVE
LEUKOCYTES UA: NEGATIVE
NITRITE UA: NEGATIVE
Protein, UA: NEGATIVE
SPEC GRAV UA: 1.015
Urobilinogen, UA: 0.2
pH, UA: 6

## 2015-07-22 LAB — HEPATIC FUNCTION PANEL
ALBUMIN: 4 g/dL (ref 3.5–5.2)
ALT: 14 U/L (ref 0–35)
AST: 11 U/L (ref 0–37)
Alkaline Phosphatase: 58 U/L (ref 39–117)
BILIRUBIN DIRECT: 0.1 mg/dL (ref 0.0–0.3)
Total Bilirubin: 0.3 mg/dL (ref 0.2–1.2)
Total Protein: 6.4 g/dL (ref 6.0–8.3)

## 2015-07-22 LAB — BASIC METABOLIC PANEL
BUN: 5 mg/dL — AB (ref 6–23)
CHLORIDE: 103 meq/L (ref 96–112)
CO2: 28 meq/L (ref 19–32)
Calcium: 9.1 mg/dL (ref 8.4–10.5)
Creatinine, Ser: 0.62 mg/dL (ref 0.40–1.20)
GFR: 115.3 mL/min (ref >60.00)
Glucose, Bld: 130 mg/dL — ABNORMAL HIGH (ref 70–99)
POTASSIUM: 4 meq/L (ref 3.5–5.1)
Sodium: 139 mEq/L (ref 135–145)

## 2015-07-22 LAB — LIPID PANEL
CHOL/HDL RATIO: 5
Cholesterol: 184 mg/dL (ref 0–200)
HDL: 39 mg/dL — AB (ref >39.00)
LDL Cholesterol: 118 mg/dL — ABNORMAL HIGH (ref 0–99)
NonHDL: 145.13
TRIGLYCERIDES: 134 mg/dL (ref 0.0–149.0)
VLDL: 26.8 mg/dL (ref 0.0–40.0)

## 2015-07-22 LAB — TSH: TSH: 1 u[IU]/mL (ref 0.35–4.50)

## 2015-07-29 ENCOUNTER — Encounter: Payer: Self-pay | Admitting: Family Medicine

## 2015-07-29 ENCOUNTER — Ambulatory Visit (INDEPENDENT_AMBULATORY_CARE_PROVIDER_SITE_OTHER): Payer: 59 | Admitting: Family Medicine

## 2015-07-29 VITALS — BP 100/79 | HR 87 | Temp 98.5°F | Ht 67.0 in | Wt 198.0 lb

## 2015-07-29 DIAGNOSIS — Z0000 Encounter for general adult medical examination without abnormal findings: Secondary | ICD-10-CM | POA: Diagnosis not present

## 2015-07-29 LAB — POCT GLYCOSYLATED HEMOGLOBIN (HGB A1C): HEMOGLOBIN A1C: 5.5

## 2015-07-29 MED ORDER — AMPHETAMINE-DEXTROAMPHET ER 20 MG PO CP24: 20.0000 mg | ORAL_CAPSULE | Freq: Two times a day (BID) | ORAL | Status: DC

## 2015-07-29 MED ORDER — ALPRAZOLAM 1 MG PO TABS: 1.0000 mg | ORAL_TABLET | Freq: Two times a day (BID) | ORAL | Status: DC | PRN

## 2015-07-29 MED ORDER — VARENICLINE TARTRATE 0.5 MG X 11 & 1 MG X 42 PO MISC: ORAL | Status: DC

## 2015-07-29 MED ORDER — AMPHETAMINE-DEXTROAMPHET ER 20 MG PO CP24
20.0000 mg | ORAL_CAPSULE | Freq: Two times a day (BID) | ORAL | Status: DC
Start: 2015-07-29 — End: 2015-07-29

## 2015-07-29 MED ORDER — SUMATRIPTAN SUCCINATE 100 MG PO TABS: ORAL_TABLET | ORAL | Status: DC

## 2015-07-29 MED ORDER — OMEPRAZOLE 40 MG PO CPDR
40.0000 mg | DELAYED_RELEASE_CAPSULE | Freq: Every day | ORAL | Status: DC
Start: 2015-07-29 — End: 2017-06-08

## 2015-07-29 MED ORDER — AMPHETAMINE-DEXTROAMPHET ER 20 MG PO CP24
20.0000 mg | ORAL_CAPSULE | Freq: Two times a day (BID) | ORAL | Status: DC
Start: 2015-07-29 — End: 2015-11-29

## 2015-07-29 MED ORDER — VARENICLINE TARTRATE 1 MG PO TABS: 1.0000 mg | ORAL_TABLET | Freq: Two times a day (BID) | ORAL | Status: DC

## 2015-07-29 NOTE — Progress Notes (Signed)
Pre visit review using our clinic review tool, if applicable. No additional management support is needed unless otherwise documented below in the visit note. 

## 2015-07-29 NOTE — Addendum Note (Signed)
Addended by: Aniceto BossNIMMONS, Benito Lemmerman A on: 07/29/2015 03:02 PM   Modules accepted: Orders

## 2015-07-29 NOTE — Progress Notes (Signed)
   Subjective:    Patient ID: Sheila [name], female    DOB: 11/06/1980, 35 y.o.   MRN: [id number]  HPI 35 yr old female for a cpx. She feels well. Her ADHD is well controlled with Adderall. Her migraines are stable. She used Chantix 2 years ago but stopped taking it too early, and she resumed smoking. Her labs showed a glucose of 130 and we questioned the accuracy of this. In fact her A1c here today is normal at 5.5.    Review of Systems  Constitutional: Negative.   HENT: Negative.   Eyes: Negative.   Respiratory: Negative.   Cardiovascular: Negative.   Gastrointestinal: Negative.   Genitourinary: Negative for dysuria, urgency, frequency, hematuria, flank pain, decreased urine volume, enuresis, difficulty urinating, pelvic pain and dyspareunia.  Musculoskeletal: Negative.   Skin: Negative.   Neurological: Negative.   Psychiatric/Behavioral: Negative.        Objective:   Physical Exam  Constitutional: She is oriented to person, place, and time. She appears well-developed and well-nourished. No distress.  HENT:  Head: Normocephalic and atraumatic.  Right Ear: External ear normal.  Left Ear: External ear normal.  Nose: Nose normal.  Mouth/Throat: Oropharynx is clear and moist. No oropharyngeal exudate.  Eyes: Conjunctivae and EOM are normal. Pupils are equal, round, and reactive to light. No scleral icterus.  Neck: Normal range of motion. Neck supple. No JVD present. No thyromegaly present.  Cardiovascular: Normal rate, regular rhythm, normal heart sounds and intact distal pulses.  Exam reveals no gallop and no friction rub.   No murmur heard. Pulmonary/Chest: Effort normal and breath sounds normal. No respiratory distress. She has no wheezes. She has no rales. She exhibits no tenderness.  Abdominal: Soft. Bowel sounds are normal. She exhibits no distension and no mass. There is no tenderness. There is no rebound and no guarding.  Musculoskeletal: Normal range of motion. She  exhibits no edema or tenderness.  Lymphadenopathy:    She has no cervical adenopathy.  Neurological: She is alert and oriented to person, place, and time. She has normal reflexes. No cranial nerve deficit. She exhibits normal muscle tone. Coordination normal.  Skin: Skin is warm and dry. No rash noted. No erythema.  Psychiatric: She has a normal mood and affect. Her behavior is normal. Judgment and thought content normal.          Assessment & Plan:  Well exam. We discussed diet and exercise. Get back on Chantix to quit smoking, and she agreed to take the entire 90 day course. She will watch her carb intake and we will recheck an A1c in 90days  Amarra Sawyer A, MD

## 2015-08-13 ENCOUNTER — Telehealth: Payer: Self-pay | Admitting: Family Medicine

## 2015-08-13 NOTE — Telephone Encounter (Signed)
Prior authorization for varenicline (CHANTIX STARTING MONTH PAK) 0.5 MG X 11 & 1 MG X 42 tablet has been denied by [employer] stating:  We are unable to approve your request for coverage for Chantix. The request did not meet the conditions necessary for coverage for the following reasons:  Per health plan criteria Chantix can be approved if:  Your patient has a history of failure, contraindications, or intolerance to one of the following:  Habitrol OTC, Nicoderm CQ OTC, Nicorette gum OTC, Nicorette lozenge OTC, Nicorette mini-lozenge OTC, Thrive gum OTC, Thrive lozenge OTC.

## 2015-08-14 NOTE — Telephone Encounter (Signed)
I spoke with pt and gave the below message and also Dr. Clent RidgesFry is aware.

## 2015-08-14 NOTE — Telephone Encounter (Signed)
Call pt first to find out if she wants to try something from below list.

## 2015-10-10 ENCOUNTER — Encounter: Payer: Self-pay | Admitting: Family Medicine

## 2015-10-10 ENCOUNTER — Telehealth: Payer: Self-pay | Admitting: Family Medicine

## 2015-10-10 ENCOUNTER — Ambulatory Visit (INDEPENDENT_AMBULATORY_CARE_PROVIDER_SITE_OTHER): Payer: 59 | Admitting: Family Medicine

## 2015-10-10 VITALS — BP 107/73 | HR 64 | Temp 98.3°F | Ht 67.0 in | Wt 200.0 lb

## 2015-10-10 DIAGNOSIS — L739 Follicular disorder, unspecified: Secondary | ICD-10-CM

## 2015-10-10 DIAGNOSIS — B079 Viral wart, unspecified: Secondary | ICD-10-CM | POA: Diagnosis not present

## 2015-10-10 MED ORDER — METRONIDAZOLE 1 % EX GEL: Freq: Two times a day (BID) | CUTANEOUS | Status: DC

## 2015-10-10 MED ORDER — IMIQUIMOD 5 % EX CREA: TOPICAL_CREAM | Freq: Every day | CUTANEOUS | Status: DC

## 2015-10-10 NOTE — Progress Notes (Signed)
   Subjective:    Patient ID: Sheila [name], female    DOB: 06/02/1980, 35 y.o.   MRN: [id number]  HPI Here for 2 things. First she has had a wart on the right index finger for about 6 months. Also one month ago she developed patches of itchy bumps in the skin of both upper arms. These come and go.    Review of Systems  Constitutional: Negative.   Skin: Positive for rash.       Objective:   Physical Exam  Constitutional: She appears well-developed and well-nourished.  Skin:  There are clusters of tiny pink papules on both upper arms. Also a small verrucous wart is on the pad of the right index finger          Assessment & Plan:  Folliculitis on the arms, treat with Metrogel bid. For the wart, treat with Imiquimod cream.  Nelwyn SalisburyFRY,Farran Amsden A, MD

## 2015-10-10 NOTE — Progress Notes (Signed)
Pre visit review using our clinic review tool, if applicable. No additional management support is needed unless otherwise documented below in the visit note. 

## 2015-10-10 NOTE — Telephone Encounter (Signed)
Pt would like dr fry to know the rx  metroNIDAZOLE (METROGEL) 1 % gel Needs a prior authorization.  Paperwork was received and given to the person doing PA

## 2015-10-11 ENCOUNTER — Encounter: Payer: Self-pay | Admitting: Family Medicine

## 2015-10-11 ENCOUNTER — Ambulatory Visit (INDEPENDENT_AMBULATORY_CARE_PROVIDER_SITE_OTHER): Payer: 59 | Admitting: Family Medicine

## 2015-10-11 VITALS — BP 114/81 | HR 69 | Temp 98.3°F | Ht 67.0 in | Wt 200.0 lb

## 2015-10-11 DIAGNOSIS — S161XXA Strain of muscle, fascia and tendon at neck level, initial encounter: Secondary | ICD-10-CM

## 2015-10-11 MED ORDER — TRAMADOL HCL 50 MG PO TABS: 100.0000 mg | ORAL_TABLET | Freq: Four times a day (QID) | ORAL | Status: DC | PRN

## 2015-10-11 MED ORDER — CYCLOBENZAPRINE HCL 10 MG PO TABS
10.0000 mg | ORAL_TABLET | Freq: Three times a day (TID) | ORAL | Status: DC | PRN
Start: 2015-10-11 — End: 2017-06-08

## 2015-10-11 NOTE — Telephone Encounter (Signed)
Looks like a FYI 

## 2015-10-11 NOTE — Progress Notes (Signed)
Pre visit review using our clinic review tool, if applicable. No additional management support is needed unless otherwise documented below in the visit note. 

## 2015-10-11 NOTE — Progress Notes (Signed)
   Subjective:    Patient ID: Sheila FlavorsShannon L Boston, female    DOB: 04/16/1981, 35 y.o.   MRN: [id number]  HPI Here for stiffness and pain the left neck and upper back. This started yesterday as she was moving furniture around. Throughout the night her muscles tightened up and this am she had a lot of pain. No radiation to the arms. Using heat and Ibuprofen.    Review of Systems  Constitutional: Negative.   Musculoskeletal: Positive for neck pain and neck stiffness.       Objective:   Physical Exam  Constitutional:  In pain, holding her head stiffly   Musculoskeletal:  There is a lot of spasm in the left posterior neck and upper back. This area is quite tender. ROM is limited by pain.           Assessment & Plan:  Neck strain. Switch to ice packs and do gentle stretches. Use Ibuprofen 800 mg every 6 hours prn. Add Flexeril for the spasm, and add Tramadol for pain. Follow up prn.  Nelwyn SalisburyFRY,Lulia Schriner A, MD

## 2015-10-15 NOTE — Telephone Encounter (Signed)
Received Prior authorization for Metronidazole [facility] gel [facility].  PA completed through CoverMyMeds.  Key: [id number].  (if denied ask MD to consider Metronidazole .75%)

## 2015-10-17 NOTE — Telephone Encounter (Signed)
PA for Metronidazole 1% gel coverage has been denied.    Metronidazole 1% gel can be approved if:  Patient has a history of failure or intolerance to metronidazole 0.75% gel (generic Metrogel).

## 2015-10-17 NOTE — Telephone Encounter (Signed)
Switch this to Metronidazole 0.75% gel to apply bid, same amount and same refills

## 2015-10-18 MED ORDER — METRONIDAZOLE 0.75 % EX GEL: 1.0000 "application " | Freq: Two times a day (BID) | CUTANEOUS | Status: DC

## 2015-10-18 NOTE — Addendum Note (Signed)
Addended by: Aniceto BossNIMMONS, Lymon Kidney A on: 10/18/2015 12:27 PM   Modules accepted: Orders, Medications

## 2015-10-18 NOTE — Telephone Encounter (Signed)
[facility], updated medication list and left a voice message for pt with below information.

## 2015-11-29 ENCOUNTER — Telehealth: Payer: Self-pay | Admitting: Family Medicine

## 2015-11-29 MED ORDER — AMPHETAMINE-DEXTROAMPHET ER 20 MG PO CP24: 20.0000 mg | ORAL_CAPSULE | Freq: Two times a day (BID) | ORAL | 0 refills | Status: DC

## 2015-11-29 NOTE — Telephone Encounter (Signed)
Script is ready for pick up here at front office and I spoke with pt.  

## 2015-11-29 NOTE — Telephone Encounter (Signed)
done

## 2015-11-29 NOTE — Telephone Encounter (Signed)
Pt needs new generic adderall xr 20 mg °

## 2015-12-24 ENCOUNTER — Telehealth: Payer: Self-pay | Admitting: Family Medicine

## 2015-12-24 NOTE — Telephone Encounter (Signed)
Pt needs early refill on alprazolam due to ins end on 12-26-15. Pt just needs one day early refill also please call pharm for early refill on adderall.

## 2015-12-24 NOTE — Telephone Encounter (Signed)
Please call her pharmacy to okay these early refills, but it is their decision whether to do it or not

## 2015-12-25 NOTE — Telephone Encounter (Signed)
I spoke with pharmacy and gave the approval for early refill, also spoke with pt.

## 2016-01-21 ENCOUNTER — Other Ambulatory Visit: Payer: Self-pay | Admitting: Obstetrics & Gynecology

## 2016-01-23 ENCOUNTER — Other Ambulatory Visit: Payer: Self-pay | Admitting: Family Medicine

## 2016-01-23 LAB — CYTOLOGY - PAP

## 2016-01-24 NOTE — Telephone Encounter (Signed)
Call in #60 with 5 rf 

## 2016-02-12 ENCOUNTER — Telehealth: Payer: Self-pay | Admitting: Family Medicine

## 2016-02-12 NOTE — Telephone Encounter (Signed)
Pt would like new rx chantix [facility]. Pt ins did not cover chantix pt has new ins

## 2016-02-12 NOTE — Telephone Encounter (Signed)
Call in Chantix starting pack #1 with no rf, also Chantix continuing pack #1 with one rf

## 2016-02-14 MED ORDER — VARENICLINE TARTRATE 1 MG PO TABS: 1.0000 mg | ORAL_TABLET | Freq: Two times a day (BID) | ORAL | 1 refills | Status: DC

## 2016-02-14 MED ORDER — VARENICLINE TARTRATE 0.5 MG X 11 & 1 MG X 42 PO MISC: ORAL | 0 refills | Status: DC

## 2016-02-14 NOTE — Telephone Encounter (Signed)
I faxed script for starter pack and e-scribe for continue pack.

## 2016-02-14 NOTE — Addendum Note (Signed)
Addended by: Aniceto BossNIMMONS, Erik Nessel A on: 02/14/2016 01:44 PM   Modules accepted: Orders

## 2016-02-17 ENCOUNTER — Telehealth: Payer: Self-pay | Admitting: Family Medicine

## 2016-02-17 NOTE — Telephone Encounter (Signed)
[facility], Chantix is not covered by insurance. I left pt a voice message with this information, advised pt to check on cost out of pocket and if we need to do anything else pt will need to contact our office.

## 2016-02-24 ENCOUNTER — Telehealth: Payer: Self-pay

## 2016-02-24 NOTE — Telephone Encounter (Signed)
Received [facility] request [facility] for Chantix 1 mg tablets. PA submitted & is pending. Key:  [id number]

## 2016-02-25 ENCOUNTER — Telehealth: Payer: Self-pay | Admitting: Family Medicine

## 2016-02-25 NOTE — Telephone Encounter (Signed)
PA approved, form faxed back to pharmacy. 

## 2016-02-25 NOTE — Telephone Encounter (Signed)
Pt need new Rx for Adderall  Pt is aware of the 3 business days for refills. °

## 2016-02-26 MED ORDER — AMPHETAMINE-DEXTROAMPHET ER 20 MG PO CP24: 20.0000 mg | ORAL_CAPSULE | Freq: Two times a day (BID) | ORAL | 0 refills | Status: DC

## 2016-02-26 NOTE — Telephone Encounter (Signed)
done

## 2016-02-27 NOTE — Telephone Encounter (Signed)
Script is ready for pick up here at front office and I spoke with pt.  

## 2016-05-01 ENCOUNTER — Telehealth: Payer: Self-pay | Admitting: Family Medicine

## 2016-05-01 NOTE — Telephone Encounter (Signed)
Pt need new Rx for Adderall  Pt is aware of the 3 business day for refills °

## 2016-05-01 NOTE — Telephone Encounter (Signed)
NO, she is not due until 05-31-16

## 2016-05-04 NOTE — Telephone Encounter (Signed)
I spoke with pt and gave below message.  

## 2016-05-20 ENCOUNTER — Other Ambulatory Visit: Payer: Self-pay | Admitting: Family Medicine

## 2016-05-29 ENCOUNTER — Telehealth: Payer: Self-pay | Admitting: Family Medicine

## 2016-05-29 NOTE — Telephone Encounter (Signed)
Pt request refill amphetamine-dextroamphetamine (ADDERALL XR) 20 MG 24 hr capsule °3 mo supply °

## 2016-06-01 MED ORDER — AMPHETAMINE-DEXTROAMPHET ER 20 MG PO CP24: 20.0000 mg | ORAL_CAPSULE | Freq: Two times a day (BID) | ORAL | 0 refills | Status: DC

## 2016-06-01 NOTE — Telephone Encounter (Signed)
done

## 2016-06-01 NOTE — Telephone Encounter (Signed)
Scripts are ready for pick up here at front office and I spoke with pt. 

## 2016-07-12 ENCOUNTER — Other Ambulatory Visit: Payer: Self-pay | Admitting: Family Medicine

## 2016-07-14 NOTE — Telephone Encounter (Signed)
Call in #60 with 5 rf 

## 2016-07-15 NOTE — Telephone Encounter (Signed)
[facility] 

## 2016-08-19 ENCOUNTER — Other Ambulatory Visit: Payer: Self-pay | Admitting: Family Medicine

## 2016-08-26 ENCOUNTER — Telehealth: Payer: Self-pay | Admitting: Family Medicine

## 2016-08-26 MED ORDER — AMPHETAMINE-DEXTROAMPHET ER 20 MG PO CP24: 20.0000 mg | ORAL_CAPSULE | Freq: Two times a day (BID) | ORAL | 0 refills | Status: DC

## 2016-08-26 NOTE — Telephone Encounter (Signed)
° ° ° °  Pt request refill of the following: ° ° °amphetamine-dextroamphetamine (ADDERALL XR) 20 MG 24 hr capsule ° ° °Phamacy: °

## 2016-08-26 NOTE — Telephone Encounter (Signed)
done

## 2016-08-26 NOTE — Telephone Encounter (Signed)
Script is ready for pick up here at front office and I spoke with pt.  

## 2016-11-26 ENCOUNTER — Telehealth: Payer: Self-pay | Admitting: Family Medicine

## 2016-11-26 MED ORDER — AMPHETAMINE-DEXTROAMPHET ER 20 MG PO CP24: 20.0000 mg | ORAL_CAPSULE | Freq: Two times a day (BID) | ORAL | 0 refills | Status: DC

## 2016-11-26 NOTE — Telephone Encounter (Signed)
° ° ° °  Pt request refill of the following: ° ° °amphetamine-dextroamphetamine (ADDERALL XR) 20 MG 24 hr capsule ° ° °Phamacy: °

## 2016-11-26 NOTE — Telephone Encounter (Signed)
Written for one month only. She needs an OV soon  

## 2016-11-27 ENCOUNTER — Telehealth: Payer: Self-pay

## 2016-11-27 NOTE — Telephone Encounter (Signed)
Called patient and let her know her prescription is at the front desk for her to pick up at her convenience  amphetamine-dextroamphetamine (ADDERALL XR) 20 MG 24 hr capsule

## 2016-12-18 ENCOUNTER — Encounter: Payer: Self-pay | Admitting: Family Medicine

## 2016-12-18 ENCOUNTER — Ambulatory Visit (INDEPENDENT_AMBULATORY_CARE_PROVIDER_SITE_OTHER): Payer: Managed Care, Other (non HMO) | Admitting: Family Medicine

## 2016-12-18 VITALS — BP 106/86 | HR 85 | Temp 98.3°F | Ht 67.0 in | Wt 211.0 lb

## 2016-12-18 DIAGNOSIS — G441 Vascular headache, not elsewhere classified: Secondary | ICD-10-CM

## 2016-12-18 DIAGNOSIS — F901 Attention-deficit hyperactivity disorder, predominantly hyperactive type: Secondary | ICD-10-CM | POA: Diagnosis not present

## 2016-12-18 DIAGNOSIS — F411 Generalized anxiety disorder: Secondary | ICD-10-CM

## 2016-12-18 MED ORDER — AMPHETAMINE-DEXTROAMPHET ER 20 MG PO CP24: 20.0000 mg | ORAL_CAPSULE | Freq: Two times a day (BID) | ORAL | 0 refills | Status: DC

## 2016-12-18 MED ORDER — AMPHETAMINE-DEXTROAMPHET ER 20 MG PO CP24
20.0000 mg | ORAL_CAPSULE | Freq: Two times a day (BID) | ORAL | 0 refills | Status: DC
Start: 2016-12-18 — End: 2017-04-12

## 2016-12-18 NOTE — Progress Notes (Signed)
   Subjective:    Patient ID: Sheila [name], female    DOB: 01/22/1981, 36 y.o.   MRN: [id number]  HPI Here to follow up. She feels great. Her ADHD is well managed and her migraines are controlled. The GERD is controlled and the anxiety is not an issue.    Review of Systems  Constitutional: Negative.   Respiratory: Negative.   Cardiovascular: Negative.   Gastrointestinal: Negative.   Neurological: Negative.   Psychiatric/Behavioral: Negative.        Objective:   Physical Exam  Constitutional: She is oriented to person, place, and time. She appears well-developed and well-nourished.  Neck: No thyromegaly present.  Cardiovascular: Normal rate, regular rhythm, normal heart sounds and intact distal pulses.   Pulmonary/Chest: Effort normal and breath sounds normal.  Lymphadenopathy:    She has no cervical adenopathy.  Neurological: She is alert and oriented to person, place, and time.  Psychiatric: She has a normal mood and affect. Her behavior is normal. Thought content normal.          Assessment & Plan:  She is doing well with ADHD, GERD, anxiety, and headaches. Meds were refilled.  Gershon Crane, MD

## 2016-12-18 NOTE — Patient Instructions (Signed)
WE NOW OFFER   Sheila [name]'s FAST TRACK!!!  SAME DAY Appointments for ACUTE CARE  Such as: Sprains, Injuries, cuts, abrasions, rashes, muscle pain, joint pain, back pain Colds, flu, sore throats, headache, allergies, cough, fever  Ear pain, sinus and eye infections Abdominal pain, nausea, vomiting, diarrhea, upset stomach Animal/insect bites  3 Easy Ways to Schedule: Walk-In Scheduling Call in scheduling Mychart Sign-up: [email or web address]         

## 2017-01-07 ENCOUNTER — Other Ambulatory Visit: Payer: Self-pay | Admitting: Family Medicine

## 2017-01-08 NOTE — Telephone Encounter (Signed)
Call in #60 with 2 rf 

## 2017-01-14 ENCOUNTER — Encounter: Payer: Self-pay | Admitting: Family Medicine

## 2017-02-22 ENCOUNTER — Other Ambulatory Visit: Payer: Self-pay | Admitting: Family Medicine

## 2017-04-07 ENCOUNTER — Other Ambulatory Visit: Payer: Self-pay | Admitting: Family Medicine

## 2017-04-07 NOTE — Telephone Encounter (Signed)
Call in #60 with 5 rf 

## 2017-04-07 NOTE — Telephone Encounter (Signed)
Sent to PCP for approval.  

## 2017-04-09 ENCOUNTER — Telehealth: Payer: Self-pay | Admitting: Family Medicine

## 2017-04-09 NOTE — Telephone Encounter (Signed)
Sent to PCP for approval.  

## 2017-04-09 NOTE — Telephone Encounter (Signed)
Copied from CRM [phone]. Topic: Quick Communication - See Telephone Encounter >> Apr 09, 2017 11:23 AM Waymon AmatoBurton, Donna F wrote: CRM for notification. See Telephone encounter for:  Pt is needing a refill on adderall best number [phone] 04/09/17.

## 2017-04-12 MED ORDER — AMPHETAMINE-DEXTROAMPHET ER 20 MG PO CP24: 20.0000 mg | ORAL_CAPSULE | Freq: Two times a day (BID) | ORAL | 0 refills | Status: DC

## 2017-04-12 NOTE — Telephone Encounter (Signed)
Done for 3 months

## 2017-04-13 NOTE — Telephone Encounter (Signed)
Sent pt a my chart message, script sent to pharmacy

## 2017-04-15 ENCOUNTER — Other Ambulatory Visit: Payer: Self-pay | Admitting: Obstetrics & Gynecology

## 2017-04-15 DIAGNOSIS — R928 Other abnormal and inconclusive findings on diagnostic imaging of breast: Secondary | ICD-10-CM

## 2017-04-21 ENCOUNTER — Ambulatory Visit
Admission: RE | Admit: 2017-04-21 | Discharge: 2017-04-21 | Disposition: A | Discharge status: Home / Self Care | Payer: Managed Care, Other (non HMO) | Source: Ambulatory Visit | Attending: Obstetrics & Gynecology | Admitting: Obstetrics & Gynecology

## 2017-04-21 DIAGNOSIS — R928 Other abnormal and inconclusive findings on diagnostic imaging of breast: Secondary | ICD-10-CM

## 2017-05-07 ENCOUNTER — Other Ambulatory Visit: Payer: Self-pay | Admitting: Family Medicine

## 2017-05-07 NOTE — Telephone Encounter (Signed)
Last OV 12/18/2016. Rx was last refilled 02/22/2017 disp 10 with 1 refill. Sent to PCP for approval.

## 2017-05-11 ENCOUNTER — Other Ambulatory Visit: Payer: Self-pay | Admitting: Family Medicine

## 2017-05-12 NOTE — Telephone Encounter (Signed)
Last OV 12/18/2016   Last refilled 02/22/2017 disp 10 with 1 refill   Sent to PCP for approval

## 2017-06-07 ENCOUNTER — Other Ambulatory Visit: Payer: Self-pay | Admitting: Family Medicine

## 2017-06-08 ENCOUNTER — Encounter: Payer: Self-pay | Admitting: Family Medicine

## 2017-06-08 ENCOUNTER — Ambulatory Visit: Payer: Managed Care, Other (non HMO) | Admitting: Family Medicine

## 2017-06-08 VITALS — BP 104/62 | HR 94 | Temp 97.8°F | Wt 204.4 lb

## 2017-06-08 DIAGNOSIS — R6889 Other general symptoms and signs: Secondary | ICD-10-CM | POA: Diagnosis not present

## 2017-06-08 LAB — POC INFLUENZA A&B (BINAX/QUICKVUE)
INFLUENZA A, POC: NEGATIVE
INFLUENZA B, POC: NEGATIVE

## 2017-06-08 MED ORDER — OSELTAMIVIR PHOSPHATE 75 MG PO CAPS: 75.0000 mg | ORAL_CAPSULE | Freq: Two times a day (BID) | ORAL | 0 refills | Status: DC

## 2017-06-08 NOTE — Progress Notes (Signed)
   Subjective:    Patient ID: Sheila [name], female    DOB: 03/06/1981, 37 y.o.   MRN: [id number]  HPI Here for 2 days of fever, body aches, a dry cough, and nausea without vomiting. Her son was tested positive for influenza yesterday. She is drinking fluids and taking Ibuprofen.   Review of Systems  Constitutional: Positive for fever.  HENT: Positive for congestion. Negative for sinus pressure, sinus pain and sore throat.   Eyes: Negative.   Respiratory: Positive for cough.   Cardiovascular: Negative.   Gastrointestinal: Positive for nausea. Negative for abdominal pain, constipation, diarrhea and vomiting.  Musculoskeletal: Positive for myalgias.       Objective:   Physical Exam  Constitutional: She appears well-developed and well-nourished.  HENT:  Right Ear: External ear normal.  Left Ear: External ear normal.  Nose: Nose normal.  Mouth/Throat: Oropharynx is clear and moist.  Eyes: Conjunctivae are normal.  Neck: No thyromegaly present.  Cardiovascular: Normal rate, regular rhythm, normal heart sounds and intact distal pulses.  Pulmonary/Chest: Effort normal and breath sounds normal. No respiratory distress. She has no wheezes. She has no rales.  Lymphadenopathy:    She has no cervical adenopathy.          Assessment & Plan:  Probable influenza. Treat with 5 days of Tamiflu.  Gershon CraneStephen Jadrien Narine, MD

## 2017-06-25 ENCOUNTER — Encounter: Payer: Self-pay | Admitting: Family Medicine

## 2017-06-28 ENCOUNTER — Encounter: Payer: Self-pay | Admitting: Family Medicine

## 2017-06-29 DIAGNOSIS — Z0289 Encounter for other administrative examinations: Secondary | ICD-10-CM

## 2017-07-01 ENCOUNTER — Other Ambulatory Visit: Payer: Self-pay | Admitting: Family Medicine

## 2017-07-02 MED ORDER — AMPHETAMINE-DEXTROAMPHET ER 20 MG PO CP24: 20.0000 mg | ORAL_CAPSULE | Freq: Two times a day (BID) | ORAL | 0 refills | Status: DC

## 2017-07-02 NOTE — Telephone Encounter (Signed)
Last ov 06/08/2017   Lasted refilled 04/12/2017 with refills until 06/13/2017   Pt is now due sent to PCP for approval advise if an OV is needed

## 2017-07-02 NOTE — Telephone Encounter (Signed)
Done

## 2017-07-16 ENCOUNTER — Encounter: Payer: Self-pay | Admitting: Family Medicine

## 2017-07-23 ENCOUNTER — Encounter: Payer: Self-pay | Admitting: Family Medicine

## 2017-07-23 DIAGNOSIS — Z0289 Encounter for other administrative examinations: Secondary | ICD-10-CM

## 2017-08-11 ENCOUNTER — Encounter: Payer: Self-pay | Admitting: Family Medicine

## 2017-08-11 ENCOUNTER — Ambulatory Visit (INDEPENDENT_AMBULATORY_CARE_PROVIDER_SITE_OTHER): Payer: Managed Care, Other (non HMO) | Admitting: Family Medicine

## 2017-08-11 VITALS — BP 134/70 | HR 80 | Temp 98.2°F | Ht 67.0 in | Wt 208.4 lb

## 2017-08-11 DIAGNOSIS — G43901 Migraine, unspecified, not intractable, with status migrainosus: Secondary | ICD-10-CM | POA: Diagnosis not present

## 2017-08-11 DIAGNOSIS — G43909 Migraine, unspecified, not intractable, without status migrainosus: Secondary | ICD-10-CM | POA: Insufficient documentation

## 2017-08-11 MED ORDER — TOPIRAMATE 25 MG PO TABS: 25.0000 mg | ORAL_TABLET | Freq: Every day | ORAL | 2 refills | Status: DC

## 2017-08-11 MED ORDER — PROMETHAZINE HCL 25 MG PO TABS: 25.0000 mg | ORAL_TABLET | ORAL | 2 refills | Status: DC | PRN

## 2017-08-11 NOTE — Progress Notes (Signed)
   Subjective:    Patient ID: Meriel FlavorsShannon L Zaremba, female    DOB: 03/16/1981, 37 y.o.   MRN: [id number]  HPI Here for worsening migraines. She used to average 2-4 a month, but now she is averaging 10-14 of these a month. They are always worst just before a menstrual cycle. She often has a lot of nausea with them. The Imitrex usually works well if she can take it early enough. No change in diet or lifestyle. She averages one caffeinated drink in the morning and another in the afternoon.    Review of Systems  Constitutional: Negative.   Respiratory: Negative.   Cardiovascular: Negative.   Neurological: Positive for headaches.       Objective:   Physical Exam  Constitutional: She is oriented to person, place, and time. She appears well-developed and well-nourished.  Cardiovascular: Normal rate, regular rhythm, normal heart sounds and intact distal pulses.  Pulmonary/Chest: Effort normal and breath sounds normal. No respiratory distress. She has no wheezes. She has no rales.  Neurological: She is alert and oriented to person, place, and time.          Assessment & Plan:  Increasing frequency of migraines. She will start on Topamax 25 mg daily at bedtime. We will titrate up the dose as needed. Use Phenergan for nausea. Use Imitrex prn.  Gershon CraneStephen Alcie Runions, MD

## 2017-08-11 NOTE — Progress Notes (Signed)
   Subjective:    Patient ID: Sheila [name], female    DOB: 02/07/1981, 37 y.o.   MRN: [id number]  HPI    Review of Systems     Objective:   Physical Exam        Assessment & Plan:

## 2017-08-27 ENCOUNTER — Ambulatory Visit (INDEPENDENT_AMBULATORY_CARE_PROVIDER_SITE_OTHER): Payer: Managed Care, Other (non HMO) | Admitting: Family Medicine

## 2017-08-27 ENCOUNTER — Encounter: Payer: Self-pay | Admitting: Family Medicine

## 2017-08-27 VITALS — BP 106/70 | HR 75 | Temp 98.5°F | Ht 67.0 in | Wt 206.8 lb

## 2017-08-27 DIAGNOSIS — J209 Acute bronchitis, unspecified: Secondary | ICD-10-CM

## 2017-08-27 MED ORDER — HYDROCODONE-HOMATROPINE 5-1.5 MG/5ML PO SYRP: 5.0000 mL | ORAL_SOLUTION | ORAL | 0 refills | Status: DC | PRN

## 2017-08-27 MED ORDER — AZITHROMYCIN 250 MG PO TABS: ORAL_TABLET | ORAL | 0 refills | Status: DC

## 2017-08-27 NOTE — Progress Notes (Signed)
   Subjective:    Patient ID: Sheila [name], female    DOB: 05-11-1980, 37 y.o.   MRN: [id number]  HPI Here for one week of chest tightness and a dry cough. No fever.    Review of Systems  HENT: Negative.   Eyes: Negative.   Respiratory: Positive for cough and chest tightness. Negative for shortness of breath and wheezing.        Objective:   Physical Exam  Constitutional: She appears well-developed and well-nourished.  HENT:  Right Ear: External ear normal.  Left Ear: External ear normal.  Nose: Nose normal.  Mouth/Throat: Oropharynx is clear and moist.  Eyes: Conjunctivae are normal.  Neck: No thyromegaly present.  Pulmonary/Chest: Effort normal. She has no rales.  Scattered wheezes   Lymphadenopathy:    She has no cervical adenopathy.          Assessment & Plan:  Bronchitis, treat with a Zpack. Gershon Crane, MD

## 2017-09-06 ENCOUNTER — Other Ambulatory Visit: Payer: Self-pay | Admitting: Family Medicine

## 2017-09-06 NOTE — Telephone Encounter (Signed)
Last OV 08/27/17 last filled 07/02/17 60 0rf

## 2017-09-08 ENCOUNTER — Other Ambulatory Visit: Payer: Self-pay | Admitting: Family Medicine

## 2017-09-29 ENCOUNTER — Other Ambulatory Visit: Payer: Self-pay | Admitting: Family Medicine

## 2017-09-29 NOTE — Telephone Encounter (Signed)
Last OV 08/27/2017   Last refilled 04/08/2017 disp 60 with 5 refills   Sent to PCP for approval

## 2017-09-29 NOTE — Telephone Encounter (Signed)
Call in #60 with 5 rf 

## 2017-10-06 ENCOUNTER — Other Ambulatory Visit: Payer: Self-pay | Admitting: Family Medicine

## 2017-10-06 MED ORDER — AMPHETAMINE-DEXTROAMPHET ER 20 MG PO CP24: 20.0000 mg | ORAL_CAPSULE | Freq: Two times a day (BID) | ORAL | 0 refills | Status: DC

## 2017-10-06 NOTE — Telephone Encounter (Signed)
Done

## 2017-10-06 NOTE — Telephone Encounter (Signed)
Last OV 08/27/2017   Last refilled 07/02/2017  Disp 60 with no refills   Sent to PCP to advise

## 2017-10-06 NOTE — Telephone Encounter (Signed)
Last OV 08/27/2017   07/02/2017 disp 60 with no refills   Sent to PCP to advise

## 2017-10-08 ENCOUNTER — Encounter: Payer: Self-pay | Admitting: Family Medicine

## 2017-10-29 ENCOUNTER — Other Ambulatory Visit: Payer: Self-pay | Admitting: Family Medicine

## 2017-11-02 ENCOUNTER — Encounter: Payer: Self-pay | Admitting: Family Medicine

## 2017-11-02 NOTE — Telephone Encounter (Signed)
Last OV 08/27/2017   Last refilled 08/11/2017 disp 30 with 2 refills   Sent to PCP for approval

## 2017-11-02 NOTE — Telephone Encounter (Signed)
Increase the Topiramate to 50 mg daily, call in #30 with 5 rf

## 2017-11-03 ENCOUNTER — Other Ambulatory Visit: Payer: Self-pay

## 2017-11-03 MED ORDER — TOPIRAMATE 50 MG PO TABS: 50.0000 mg | ORAL_TABLET | Freq: Two times a day (BID) | ORAL | 5 refills | Status: DC

## 2017-11-03 NOTE — Telephone Encounter (Signed)
Increase the Topiramate to 50 mg daily, call in #30 with 5 rf

## 2017-11-08 ENCOUNTER — Encounter: Payer: Self-pay | Admitting: Family Medicine

## 2017-11-08 NOTE — Telephone Encounter (Signed)
Please advise 

## 2017-11-09 ENCOUNTER — Other Ambulatory Visit: Payer: Self-pay

## 2017-11-09 NOTE — Telephone Encounter (Signed)
Please tell her pharmacy to fill this today

## 2017-11-18 ENCOUNTER — Encounter: Payer: Self-pay | Admitting: Family Medicine

## 2017-11-22 NOTE — Telephone Encounter (Signed)
I would be happy to see her daughter. Please let them know at the front desk

## 2017-11-22 NOTE — Telephone Encounter (Signed)
I have notified the Patient that Dr. Clent RidgesFry has agreed to see her as a patient. Will send to the front desk as FYI.

## 2017-11-23 NOTE — Telephone Encounter (Signed)
Sent newpt pkg to [facility] to the address on file 11/22/17///ltd

## 2018-01-11 ENCOUNTER — Telehealth: Payer: Self-pay | Admitting: Family Medicine

## 2018-01-11 NOTE — Telephone Encounter (Signed)
Dr. Fry please advise of refills. thanks 

## 2018-01-11 NOTE — Telephone Encounter (Signed)
Copied from CRM [id number]. Topic: Quick Communication - Rx Refill/Question >> Jan 11, 2018 12:33 PM Gaynelle AduPoole, Shalonda wrote: Medication: amphetamine-dextroamphetamine (ADDERALL XR) 20 MG 24 hr capsule   Has the patient contacted their pharmacy? No   Preferred Pharmacy (with phone number or street name): [id number] [place] [place] - [address] [place] [id number] Phone: [phone] Fax: [phone] Not a 24 hour pharmacy; exact hours not known.    Agent: Please be advised that RX refills may take up to 3 business days. We ask that you follow-up with your pharmacy.

## 2018-01-12 MED ORDER — AMPHETAMINE-DEXTROAMPHET ER 20 MG PO CP24: 20.0000 mg | ORAL_CAPSULE | Freq: Two times a day (BID) | ORAL | 0 refills | Status: DC

## 2018-01-12 NOTE — Telephone Encounter (Signed)
Called and spoke with pt and she is aware of rx that has been sent to her pharmacy.   

## 2018-01-12 NOTE — Telephone Encounter (Signed)
Done

## 2018-04-01 ENCOUNTER — Other Ambulatory Visit: Payer: Self-pay | Admitting: Family Medicine

## 2018-04-01 NOTE — Telephone Encounter (Signed)
Pt calling to check on this.  States that she wasn't aware that she had no refills at the pharmacy left and she is completely out of medication.

## 2018-04-01 NOTE — Telephone Encounter (Signed)
Requested medication (s) are due for refill today: yes  Requested medication (s) are on the active medication list: yes    Last refill: 09/29/17  #60 5 refills  Future visit scheduled no  Notes to clinic:not delegated  Requested Prescriptions  Pending Prescriptions Disp Refills   ALPRAZolam (XANAX) 1 MG tablet [Pharmacy Med Name: ALPRAZOLAM 1 MG TABLET] 60 tablet     Sig: TAKE 1 TABLET TWICE A DAY AS NEEDED     Not Delegated - Psychiatry:  Anxiolytics/Hypnotics Failed - 04/01/2018  3:51 PM      Failed - This refill cannot be delegated      Failed - Urine Drug Screen completed in last 360 days.      Failed - Valid encounter within last 6 months    Recent Outpatient Visits          7 months ago Acute bronchitis, unspecified organism   [facility] Tera [facility]   7 months ago Migraine with status migrainosus, not intractable, unspecified migraine type   [facility] Tera [facility] A, [facility]   9 months ago Flu-like symptoms   [facility] Tera MaterStephen A, MD   1 year ago Attention deficit hyperactivity disorder (ADHD), predominantly hyperactive type   [facility] Tera MaterStephen A, MD   2 years ago Neck strain, initial Engineer, manufacturing systemsencounter   [facility] Tera [facility] A, [facility]

## 2018-04-04 ENCOUNTER — Other Ambulatory Visit: Payer: Self-pay | Admitting: Family Medicine

## 2018-04-05 NOTE — Telephone Encounter (Signed)
Pt calling again. She states being out of alprazolam since Friday. Pt notes that she is going out of town 04/06/18 and needing RX to be sent in before leaving. [facility] [place] [facility] [place]  [id number] [place] [place] - [address]

## 2018-04-06 NOTE — Telephone Encounter (Signed)
Pt states she is leaving town now and will need [facility] script [facility] [address] [place]. Phone number is [phone]

## 2018-04-06 NOTE — Telephone Encounter (Signed)
Pt calling again to check status of refill request. Per chart pt has not been seen for anxiety since 12/18/16.

## 2018-04-06 NOTE — Telephone Encounter (Signed)
Patient is calling again to check the status of previous request , please advise

## 2018-04-06 NOTE — Telephone Encounter (Signed)
Patient calling and states that she is leaving a 1pm today to go out of town and has not received her prescription yet. Spoke to [place] at the office and was advised to tell the patient that she is sending Dr Clent RidgesFry a message and this will be taken care of today.

## 2018-04-07 NOTE — Telephone Encounter (Signed)
Called the pharmacy and left this on the VM for them to get this filled for her.  I have called and spoke with the pt and she is aware.

## 2018-04-07 NOTE — Telephone Encounter (Signed)
Dr. Fry please advise of refill. Thanks 

## 2018-04-07 NOTE — Telephone Encounter (Signed)
Pt is very upset that this has not been approved yet for her alprazalam and is wanting it taken care of today -she has been waiting since Friday and [place] where [place] sent    Pt states she will call back in two hours to check status

## 2018-04-07 NOTE — Telephone Encounter (Signed)
Call in #60 with 5 rf 

## 2018-04-07 NOTE — Telephone Encounter (Signed)
Message routed to PCP CMA for refills. 

## 2018-04-08 MED ORDER — AMPHETAMINE-DEXTROAMPHET ER 20 MG PO CP24: 20.0000 mg | ORAL_CAPSULE | Freq: Two times a day (BID) | ORAL | 0 refills | Status: DC

## 2018-04-08 NOTE — Telephone Encounter (Signed)
Done

## 2018-04-13 ENCOUNTER — Other Ambulatory Visit: Payer: Self-pay | Admitting: Family Medicine

## 2018-05-02 ENCOUNTER — Ambulatory Visit: Payer: Managed Care, Other (non HMO) | Admitting: Family Medicine

## 2018-05-02 ENCOUNTER — Encounter: Payer: Self-pay | Admitting: Family Medicine

## 2018-05-02 VITALS — BP 124/80 | HR 71 | Temp 97.8°F | Wt 197.2 lb

## 2018-05-02 DIAGNOSIS — F901 Attention-deficit hyperactivity disorder, predominantly hyperactive type: Secondary | ICD-10-CM | POA: Diagnosis not present

## 2018-05-02 DIAGNOSIS — F411 Generalized anxiety disorder: Secondary | ICD-10-CM | POA: Diagnosis not present

## 2018-05-02 MED ORDER — ALPRAZOLAM 1 MG PO TABS: 1.0000 mg | ORAL_TABLET | Freq: Two times a day (BID) | ORAL | 5 refills | Status: DC | PRN

## 2018-05-02 MED ORDER — AMPHETAMINE-DEXTROAMPHET ER 20 MG PO CP24: 20.0000 mg | ORAL_CAPSULE | ORAL | 0 refills | Status: DC

## 2018-05-02 MED ORDER — AMPHETAMINE-DEXTROAMPHETAMINE 20 MG PO TABS: 20.0000 mg | ORAL_TABLET | Freq: Every day | ORAL | 0 refills | Status: DC

## 2018-05-02 NOTE — Progress Notes (Signed)
   Subjective:    Patient ID: Sheila [name], female    DOB: 02-28-1981, 38 y.o.   MRN: [id number]  HPI Here to follow up. She has been doing well, and the Adderall XR bid has worked well for her. However she recently learned that her insurance company will no longer cover this medication twice a day.    Review of Systems  Constitutional: Negative.   Respiratory: Negative.   Cardiovascular: Negative.   Neurological: Negative.   Psychiatric/Behavioral: Negative.        Objective:   Physical Exam Constitutional:      Appearance: Normal appearance.  Cardiovascular:     Rate and Rhythm: Normal rate and regular rhythm.     Pulses: Normal pulses.     Heart sounds: Normal heart sounds.  Pulmonary:     Effort: Pulmonary effort is normal.     Breath sounds: Normal breath sounds.  Neurological:     General: No focal deficit present.     Mental Status: She is alert and oriented to person, place, and time.  Psychiatric:        Mood and Affect: Mood normal.        Thought Content: Thought content normal.        Judgment: Judgment normal.           Assessment & Plan:  Her anxiety is stable, we refilled the Xanax. For the ADHD, we will change her Adderall to take a 20 mg XR in the mornings and an immediate release 20 mg in the afternoons.  Gershon Crane, MD

## 2018-05-25 ENCOUNTER — Ambulatory Visit: Payer: Managed Care, Other (non HMO) | Admitting: Family Medicine

## 2018-05-25 ENCOUNTER — Encounter: Payer: Self-pay | Admitting: Family Medicine

## 2018-05-25 VITALS — BP 120/70 | HR 66 | Temp 98.0°F | Wt 199.0 lb

## 2018-05-25 DIAGNOSIS — J019 Acute sinusitis, unspecified: Secondary | ICD-10-CM | POA: Diagnosis not present

## 2018-05-25 MED ORDER — AZITHROMYCIN 250 MG PO TABS: ORAL_TABLET | ORAL | 0 refills | Status: DC

## 2018-05-25 NOTE — Progress Notes (Signed)
   Subjective:    Patient ID: Sheila [name], female    DOB: [birth year], 38 y.o.   MRN: [id number]  HPI Here for 5 days of stuffy head, PND, a dry cough, and body aches. No fever.    Review of Systems  Constitutional: Negative.   HENT: Positive for congestion, postnasal drip and sinus pressure. Negative for sinus pain and sore throat.   Eyes: Negative.   Respiratory: Positive for cough.        Objective:   Physical Exam Constitutional:      Appearance: Normal appearance. She is not ill-appearing.  HENT:     Right Ear: Tympanic membrane and ear canal normal.     Left Ear: Tympanic membrane and ear canal normal.     Nose: Nose normal.     Mouth/Throat:     Pharynx: Oropharynx is clear.  Eyes:     Conjunctiva/sclera: Conjunctivae normal.  Pulmonary:     Effort: Pulmonary effort is normal. No respiratory distress.     Breath sounds: Normal breath sounds. No stridor. No wheezing, rhonchi or rales.  Lymphadenopathy:     Cervical: No cervical adenopathy.  Neurological:     Mental Status: She is alert.           Assessment & Plan:  Sinusitis, treat with a Zpack. Add Mucinex prn. Gershon Crane, MD

## 2018-06-04 ENCOUNTER — Other Ambulatory Visit: Payer: Self-pay | Admitting: Family Medicine

## 2018-07-16 ENCOUNTER — Other Ambulatory Visit: Payer: Self-pay | Admitting: Family Medicine

## 2018-07-18 ENCOUNTER — Encounter: Payer: Self-pay | Admitting: Family Medicine

## 2018-07-19 NOTE — Telephone Encounter (Signed)
Dr. Fry please advise on refills. Thanks  

## 2018-07-19 NOTE — Telephone Encounter (Signed)
Tell her to call [facility] at [phone] to make an appt. I know Mellody Dance here at [place] can see her quickly

## 2018-07-19 NOTE — Telephone Encounter (Signed)
Dr. Fry please advise. Thanks  

## 2018-07-20 MED ORDER — AMPHETAMINE-DEXTROAMPHET ER 20 MG PO CP24: 20.0000 mg | ORAL_CAPSULE | ORAL | 0 refills | Status: DC

## 2018-07-20 MED ORDER — AMPHETAMINE-DEXTROAMPHETAMINE 20 MG PO TABS: 20.0000 mg | ORAL_TABLET | Freq: Every day | ORAL | 0 refills | Status: DC

## 2018-07-20 NOTE — Telephone Encounter (Signed)
Done

## 2018-07-25 NOTE — Telephone Encounter (Signed)
Dr. Fry please advise. Thanks  

## 2018-07-25 NOTE — Telephone Encounter (Signed)
Tell her to speak to the Medicaid office and they can send her a list of local therapists that take Medicaid

## 2018-08-10 ENCOUNTER — Encounter: Payer: Self-pay | Admitting: Family Medicine

## 2018-09-12 ENCOUNTER — Other Ambulatory Visit: Payer: Self-pay | Admitting: Family Medicine

## 2018-09-14 ENCOUNTER — Encounter: Payer: Self-pay | Admitting: Family Medicine

## 2018-10-18 ENCOUNTER — Other Ambulatory Visit: Payer: Self-pay | Admitting: Family Medicine

## 2018-10-25 MED ORDER — AMPHETAMINE-DEXTROAMPHET ER 20 MG PO CP24: 20.0000 mg | ORAL_CAPSULE | ORAL | 0 refills | Status: DC

## 2018-10-25 MED ORDER — AMPHETAMINE-DEXTROAMPHETAMINE 20 MG PO TABS: 20.0000 mg | ORAL_TABLET | Freq: Every day | ORAL | 0 refills | Status: DC

## 2018-10-25 NOTE — Telephone Encounter (Signed)
Dr. Fry please advise on refills thanks  

## 2018-10-25 NOTE — Telephone Encounter (Signed)
I refilled both the XR and the regular

## 2018-11-11 ENCOUNTER — Other Ambulatory Visit: Payer: Self-pay | Admitting: Family Medicine

## 2018-11-14 ENCOUNTER — Other Ambulatory Visit: Payer: Self-pay | Admitting: Family Medicine

## 2018-11-15 ENCOUNTER — Encounter: Payer: Self-pay | Admitting: Family Medicine

## 2018-11-15 MED ORDER — ALPRAZOLAM 1 MG PO TABS: 1.0000 mg | ORAL_TABLET | Freq: Two times a day (BID) | ORAL | 5 refills | Status: DC | PRN

## 2018-11-15 NOTE — Telephone Encounter (Signed)
Last filled 05/02/2018 and 12/29/2018 Last OV 05/02/2018  Ok to fill?

## 2018-11-15 NOTE — Telephone Encounter (Signed)
I refilled the Xanax. However she already has refills available for the Adderall until 01-28-19

## 2018-12-30 ENCOUNTER — Other Ambulatory Visit: Payer: Self-pay | Admitting: Family Medicine

## 2019-01-26 ENCOUNTER — Telehealth: Payer: Self-pay

## 2019-01-26 NOTE — Telephone Encounter (Signed)
Tried to call but was on hold for 15 min. Will try again shortly.

## 2019-01-26 NOTE — Telephone Encounter (Signed)
Doree Fudge ([employer] Razor) [phone] Need help? Call us at [phone] Status Shared / Accessed Online(Not sent to plan) Next Steps Complete the request below, then click "Send to Plan."  Drug SUMAtriptan Succinate 100MG  tablets Form [employer] Call-In Form To initiate the prior authorization process for this medication, please contact [employer] at [phone]. [phone] Original Claim Info 76,75 To initiate an authorization request for this medication, please contact [employer] at [phone].

## 2019-02-04 ENCOUNTER — Other Ambulatory Visit: Payer: Self-pay | Admitting: Family Medicine

## 2019-02-06 NOTE — Telephone Encounter (Signed)
Spoke to pt and she stated she has medicaid now. Pt stated that she has her refills. Nothing further needed.

## 2019-02-06 NOTE — Telephone Encounter (Signed)
Called again and the same thing happened. Unable to do prior auth.

## 2019-02-15 ENCOUNTER — Other Ambulatory Visit: Payer: Self-pay | Admitting: Family Medicine

## 2019-02-16 ENCOUNTER — Other Ambulatory Visit: Payer: Self-pay | Admitting: Family Medicine

## 2019-02-17 ENCOUNTER — Other Ambulatory Visit: Payer: Self-pay | Admitting: Family Medicine

## 2019-02-17 ENCOUNTER — Telehealth: Payer: Self-pay | Admitting: Family Medicine

## 2019-02-17 ENCOUNTER — Encounter: Payer: Self-pay | Admitting: Family Medicine

## 2019-02-17 MED ORDER — AMPHETAMINE-DEXTROAMPHET ER 20 MG PO CP24: 20.0000 mg | ORAL_CAPSULE | ORAL | 0 refills | Status: DC

## 2019-02-17 MED ORDER — AMPHETAMINE-DEXTROAMPHETAMINE 20 MG PO TABS: 20.0000 mg | ORAL_TABLET | Freq: Every day | ORAL | 0 refills | Status: DC

## 2019-02-17 NOTE — Telephone Encounter (Signed)
Sent pt a mychart message to see if she had enough medication to last until Monday.

## 2019-02-17 NOTE — Telephone Encounter (Signed)
Requested medication (s) are due for refill today: yes  Requested medication (s) are on the active medication list: yes  Last refill:  12/29/2018  Future visit scheduled: no  Notes to clinic:  Refill cannot be delegated    Requested Prescriptions  Pending Prescriptions Disp Refills   amphetamine-dextroamphetamine (ADDERALL) 20 MG tablet 30 tablet 0    Sig: Take 1 tablet (20 mg total) by mouth daily. Take one every afternoon     Not Delegated - Psychiatry:  Stimulants/ADHD Failed - 02/17/2019  1:57 PM      Failed - This refill cannot be delegated      Failed - Urine Drug Screen completed in last 360 days.      Failed - Valid encounter within last 3 months    Recent Outpatient Visits          8 months ago Acute sinusitis, recurrence not specified, unspecified location   [facility] [place], [place]   9 months ago Attention deficit hyperactivity disorder (ADHD), predominantly hyperactive type   Therapist, [facility], Ishmael Holter, MD   1 year ago Acute bronchitis, unspecified organism   Therapist, music at [facility], Ishmael Holter, MD   1 year ago Migraine with status migrainosus, not intractable, unspecified migraine type   [facility], Ishmael Holter, MD   1 year ago Flu-like symptoms   Therapist, music at [facility], Ishmael Holter, MD

## 2019-02-17 NOTE — Telephone Encounter (Signed)
Ok for refill? 

## 2019-02-17 NOTE — Telephone Encounter (Signed)
Medication Refill - Medication: amphetamine-dextroamphetamine (ADDERALL) 20 MG tablet   Pt runs out of medication tomorrow, has been waiting for refill all week.  Has the patient contacted their pharmacy? Yes.   (Agent: If no, request that the patient contact the pharmacy for the refill.) (Agent: If yes, when and what did the pharmacy advise?)  Preferred Pharmacy (with phone number or street name):  [id number] [place], [place] AT [address]  [place] [place] [address]  Phone: [phone] Fax: [phone]     Agent: Please be advised that RX refills may take up to 3 business days. We ask that you follow-up with your pharmacy.

## 2019-02-22 ENCOUNTER — Other Ambulatory Visit: Payer: Self-pay

## 2019-03-06 ENCOUNTER — Other Ambulatory Visit: Payer: Self-pay | Admitting: Family Medicine

## 2019-03-16 ENCOUNTER — Other Ambulatory Visit: Payer: Self-pay | Admitting: Family Medicine

## 2019-03-20 ENCOUNTER — Other Ambulatory Visit: Payer: Self-pay | Admitting: Family Medicine

## 2019-03-20 ENCOUNTER — Encounter: Payer: Self-pay | Admitting: Family Medicine

## 2019-03-20 MED ORDER — AMPHETAMINE-DEXTROAMPHETAMINE 20 MG PO TABS: 20.0000 mg | ORAL_TABLET | Freq: Every day | ORAL | 0 refills | Status: DC

## 2019-03-20 MED ORDER — AMPHETAMINE-DEXTROAMPHET ER 20 MG PO CP24: 20.0000 mg | ORAL_CAPSULE | ORAL | 0 refills | Status: DC

## 2019-03-20 NOTE — Telephone Encounter (Signed)
Done

## 2019-03-20 NOTE — Telephone Encounter (Signed)
Refill request and this medication is not delegated.  Forwarding to providers office.

## 2019-03-20 NOTE — Telephone Encounter (Signed)
Medication Refill - Medication:   amphetamine-dextroamphetamine (ADDERALL XR) 20 MG 24 hr capsule(Expired)    amphetamine-dextroamphetamine (ADDERALL) 20 MG tablet(Expired)      Has the patient contacted their pharmacy? Yes.   (Agent: If no, request that the patient contact the pharmacy for the refill.) (Agent: If yes, when and what did the pharmacy advise?)  Preferred Pharmacy (with phone number or street name[id number] [place], [place]. AT [address]  Agent: Please be advised that RX refills may take up to 3 business days. We ask that you follow-up with your pharmacy.

## 2019-03-20 NOTE — Telephone Encounter (Signed)
Forwarding to PCP for approval  

## 2019-03-21 NOTE — Telephone Encounter (Signed)
Okay for refill?  

## 2019-03-22 NOTE — Telephone Encounter (Signed)
I already sent in refills to last until February

## 2019-03-27 NOTE — Telephone Encounter (Signed)
Looks medication was refilled on 11/23

## 2019-03-28 ENCOUNTER — Other Ambulatory Visit: Payer: Self-pay | Admitting: Family Medicine

## 2019-04-15 ENCOUNTER — Other Ambulatory Visit: Payer: Self-pay | Admitting: Family Medicine

## 2019-04-26 ENCOUNTER — Other Ambulatory Visit: Payer: Self-pay | Admitting: Family Medicine

## 2019-05-05 ENCOUNTER — Other Ambulatory Visit: Payer: Self-pay | Admitting: Family Medicine

## 2019-05-05 NOTE — Telephone Encounter (Signed)
Pt called saying her prescription for xanax was denied due to her needing a fu appt.  She states she will not have insurance until Feb and really needs a refill now.  CB#  [phone]

## 2019-05-07 MED ORDER — ALPRAZOLAM 1 MG PO TABS: 1.0000 mg | ORAL_TABLET | Freq: Two times a day (BID) | ORAL | 1 refills | Status: DC | PRN

## 2019-05-07 NOTE — Addendum Note (Signed)
Addended by: Gershon Crane A on: 05/07/2019 03:32 PM   Modules accepted: Orders

## 2019-05-07 NOTE — Telephone Encounter (Signed)
I sent in a 2 month supply until she can follow up

## 2019-05-13 ENCOUNTER — Other Ambulatory Visit: Payer: Self-pay | Admitting: Family Medicine

## 2019-06-06 ENCOUNTER — Telehealth: Payer: Self-pay | Admitting: Family Medicine

## 2019-06-06 NOTE — Telephone Encounter (Signed)
Pt now has Medicaid and wants to know if there is a provider that he can refer her and her daughter to that accepts Medicaid.

## 2019-06-07 ENCOUNTER — Other Ambulatory Visit: Payer: Self-pay | Admitting: Family Medicine

## 2019-06-09 NOTE — Telephone Encounter (Signed)
I do not know anyone in particular, but tell her she can still see me if she requests this to Medicaid. I still see other Medicaid patients

## 2019-06-12 NOTE — Telephone Encounter (Signed)
She needs to contact the insurance company directly and request to keep seeing [place]

## 2019-06-19 ENCOUNTER — Other Ambulatory Visit: Payer: Self-pay | Admitting: Family Medicine

## 2019-06-19 MED ORDER — AMPHETAMINE-DEXTROAMPHET ER 20 MG PO CP24: 20.0000 mg | ORAL_CAPSULE | ORAL | 0 refills | Status: DC

## 2019-06-19 NOTE — Telephone Encounter (Signed)
Last filled 05/19/2018 Last OV 05/25/2018  Ok to fill?

## 2019-06-19 NOTE — Telephone Encounter (Signed)
Yes I sent in refills for this month

## 2019-06-20 NOTE — Telephone Encounter (Signed)
The patient called today because the pharmacy is stating that they didn't receive the Rx refill for   amphetamine-dextroamphetamine (ADDERALL XR) 20 MG 24 hr capsule   Please advise

## 2019-06-21 NOTE — Telephone Encounter (Signed)
Please contact her pharmacy and see what is going on. I sent this refill in on 06-19-19.

## 2019-06-21 NOTE — Telephone Encounter (Signed)
This has been taking care of pt received Rx yesterday.

## 2019-06-22 MED ORDER — AMPHETAMINE-DEXTROAMPHETAMINE 20 MG PO TABS: 20.0000 mg | ORAL_TABLET | Freq: Every day | ORAL | 0 refills | Status: DC

## 2019-06-22 NOTE — Addendum Note (Signed)
Addended by: Gershon Crane A on: 06/22/2019 08:32 AM   Modules accepted: Orders

## 2019-06-22 NOTE — Telephone Encounter (Signed)
Last filled 05/20/2019 Last OV 05/22/2018  Ok to fill?

## 2019-06-22 NOTE — Telephone Encounter (Signed)
Done

## 2019-06-22 NOTE — Telephone Encounter (Signed)
Pt stated that the pharmacy gave her the extended release adderall but not the other adderall and she is still needing that filled.  [facility] [address] FAX: [phone]

## 2019-06-26 ENCOUNTER — Other Ambulatory Visit: Payer: Self-pay | Admitting: Family Medicine

## 2019-07-05 ENCOUNTER — Other Ambulatory Visit: Payer: Self-pay | Admitting: Family Medicine

## 2019-07-22 ENCOUNTER — Other Ambulatory Visit: Payer: Self-pay | Admitting: Family Medicine

## 2019-08-01 ENCOUNTER — Other Ambulatory Visit: Payer: Self-pay | Admitting: Family Medicine

## 2019-08-17 ENCOUNTER — Other Ambulatory Visit: Payer: Self-pay | Admitting: Family Medicine

## 2019-10-05 DIAGNOSIS — F909 Attention-deficit hyperactivity disorder, unspecified type: Secondary | ICD-10-CM | POA: Diagnosis not present

## 2019-10-05 DIAGNOSIS — F411 Generalized anxiety disorder: Secondary | ICD-10-CM | POA: Diagnosis not present

## 2019-10-26 DIAGNOSIS — Z419 Encounter for procedure for purposes other than remedying health state, unspecified: Secondary | ICD-10-CM | POA: Diagnosis not present

## 2019-11-06 ENCOUNTER — Ambulatory Visit: Payer: Self-pay | Admitting: Family Medicine

## 2019-11-17 ENCOUNTER — Encounter: Payer: Self-pay | Admitting: Family Medicine

## 2019-11-17 ENCOUNTER — Other Ambulatory Visit: Payer: Self-pay

## 2019-11-17 ENCOUNTER — Ambulatory Visit (INDEPENDENT_AMBULATORY_CARE_PROVIDER_SITE_OTHER): Payer: BC Managed Care – PPO | Admitting: Family Medicine

## 2019-11-17 VITALS — BP 120/78 | HR 95 | Temp 98.3°F | Ht 67.75 in | Wt 188.0 lb

## 2019-11-17 DIAGNOSIS — Z23 Encounter for immunization: Secondary | ICD-10-CM

## 2019-11-17 DIAGNOSIS — Z0000 Encounter for general adult medical examination without abnormal findings: Secondary | ICD-10-CM | POA: Diagnosis not present

## 2019-11-17 MED ORDER — AMPHETAMINE-DEXTROAMPHET ER 20 MG PO CP24: 20.0000 mg | ORAL_CAPSULE | ORAL | 0 refills | Status: DC

## 2019-11-17 MED ORDER — AMPHETAMINE-DEXTROAMPHETAMINE 20 MG PO TABS: 20.0000 mg | ORAL_TABLET | Freq: Every day | ORAL | 0 refills | Status: DC

## 2019-11-17 NOTE — Progress Notes (Signed)
   Subjective:    Patient ID: Sheila [name], female    DOB: 10/30/1980, 39 y.o.   MRN: [id number]  HPI Here for a well exam. She had been seeing another provider for awhile when she switched to [facility] insurance, but now she has returned to our clinic. She feels well except for some generalized fatigue recently. She sleeps well and denies any other issues. Her medications are the same.    Review of Systems  Constitutional: Positive for fatigue.  HENT: Negative.   Eyes: Negative.   Respiratory: Negative.   Cardiovascular: Negative.   Gastrointestinal: Negative.   Genitourinary: Negative for decreased urine volume, difficulty urinating, dyspareunia, dysuria, enuresis, flank pain, frequency, hematuria, pelvic pain and urgency.  Musculoskeletal: Negative.   Skin: Negative.   Neurological: Negative.   Psychiatric/Behavioral: Negative.        Objective:   Physical Exam Constitutional:      General: She is not in acute distress.    Appearance: She is well-developed.  HENT:     Head: Normocephalic and atraumatic.     Right Ear: External ear normal.     Left Ear: External ear normal.     Nose: Nose normal.     Mouth/Throat:     Pharynx: No oropharyngeal exudate.  Eyes:     General: No scleral icterus.    Conjunctiva/sclera: Conjunctivae normal.     Pupils: Pupils are equal, round, and reactive to light.  Neck:     Thyroid: No thyromegaly.     Vascular: No JVD.  Cardiovascular:     Rate and Rhythm: Normal rate and regular rhythm.     Heart sounds: Normal heart sounds. No murmur heard.  No friction rub. No gallop.   Pulmonary:     Effort: Pulmonary effort is normal. No respiratory distress.     Breath sounds: Normal breath sounds. No wheezing or rales.  Chest:     Chest wall: No tenderness.  Abdominal:     General: Bowel sounds are normal. There is no distension.     Palpations: Abdomen is soft. There is no mass.     Tenderness: There is no abdominal tenderness. There is no  guarding or rebound.  Musculoskeletal:        General: No tenderness. Normal range of motion.     Cervical back: Normal range of motion and neck supple.  Lymphadenopathy:     Cervical: No cervical adenopathy.  Skin:    General: Skin is warm and dry.     Findings: No erythema or rash.  Neurological:     Mental Status: She is alert and oriented to person, place, and time.     Cranial Nerves: No cranial nerve deficit.     Motor: No abnormal muscle tone.     Coordination: Coordination normal.     Deep Tendon Reflexes: Reflexes are normal and symmetric. Reflexes normal.  Psychiatric:        Behavior: Behavior normal.        Thought Content: Thought content normal.        Judgment: Judgment normal.           Assessment & Plan:  Well exam. We discussed diet and exercise. Get fasting labs soon.  Gershon Crane, MD

## 2019-11-18 IMAGING — MG 2D DIGITAL DIAGNOSTIC UNILATERAL LEFT MAMMOGRAM WITH CAD AND ADJ
9 series · 9 of 21 positions shown · non-contrast
Comparison: 04/12/2017 baseline screening mammogram

CLINICAL DATA: 36-year-old female for further evaluation of
possible left breast mass on baseline screening mammogram.

EXAM:
2D DIGITAL DIAGNOSTIC LEFT MAMMOGRAM WITH ADJUNCT TOMO
ULTRASOUND LEFT BREAST

[L MLO (1 of 2)]
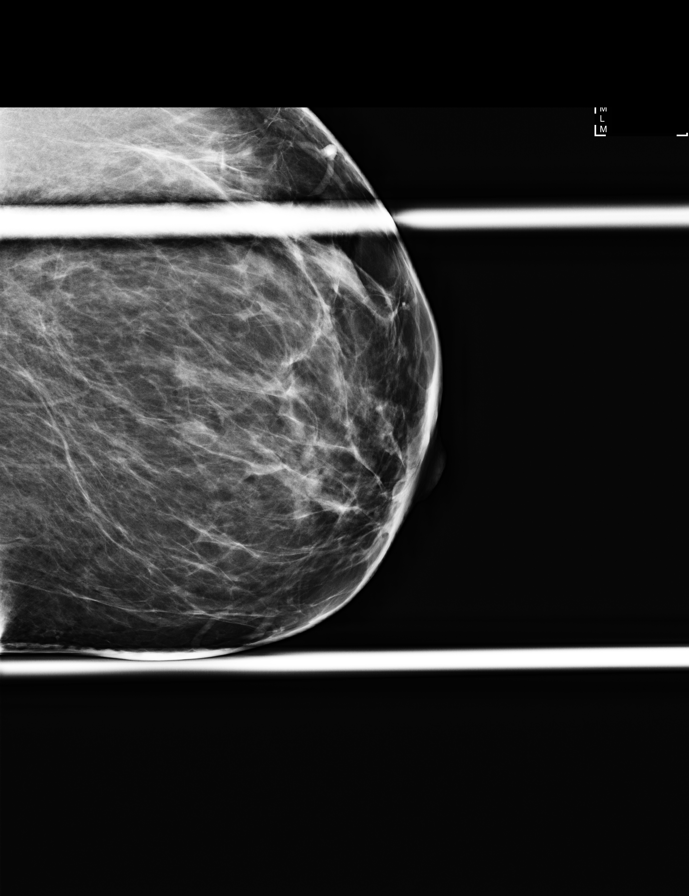

[L MLO (2 of 2)]
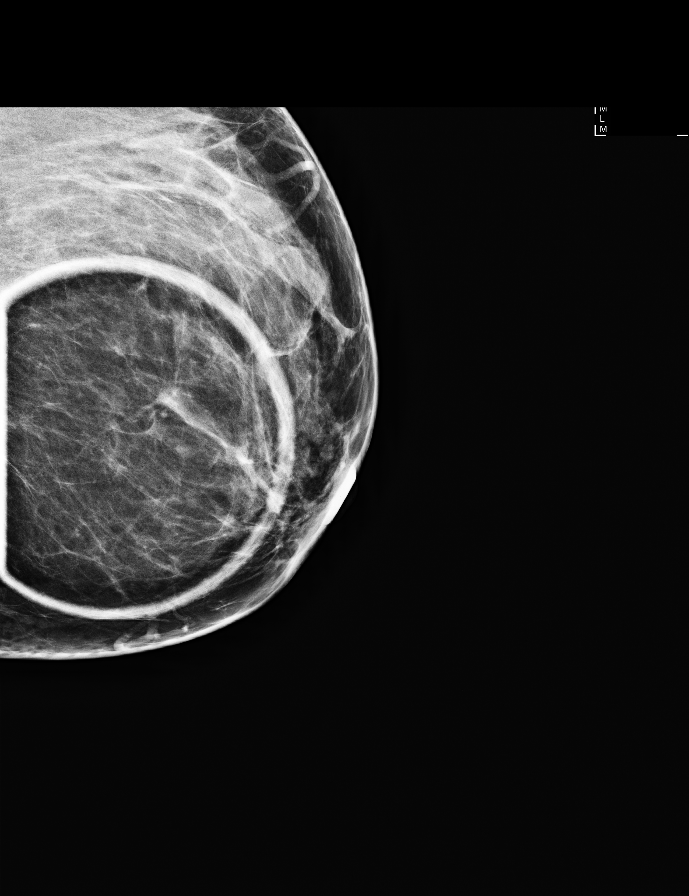

[L CC synth-2D]
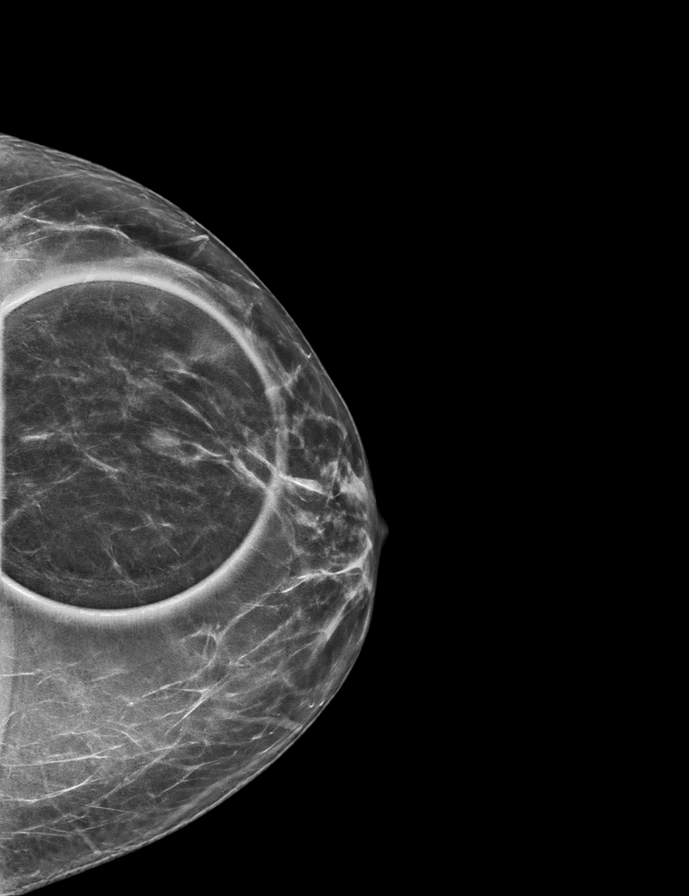

[L MLO synth-2D (1 of 2)]
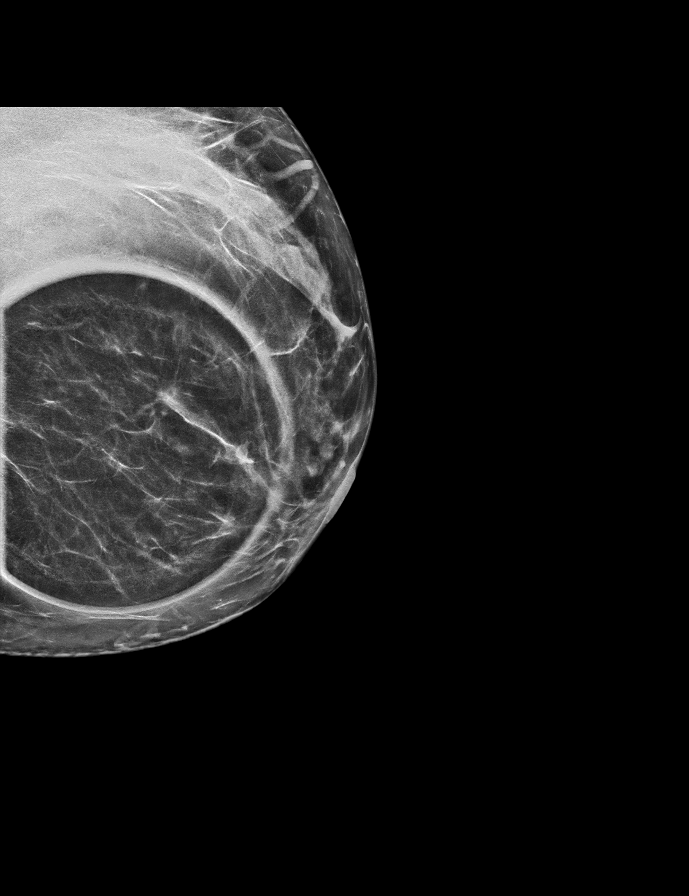

[L CC]
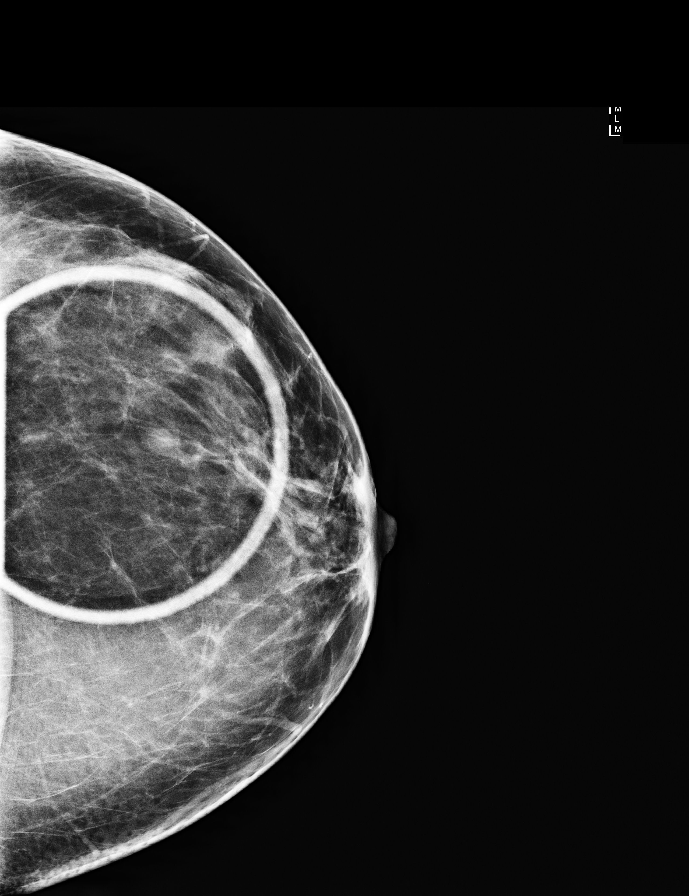

[L MLO synth-2D (2 of 2)]
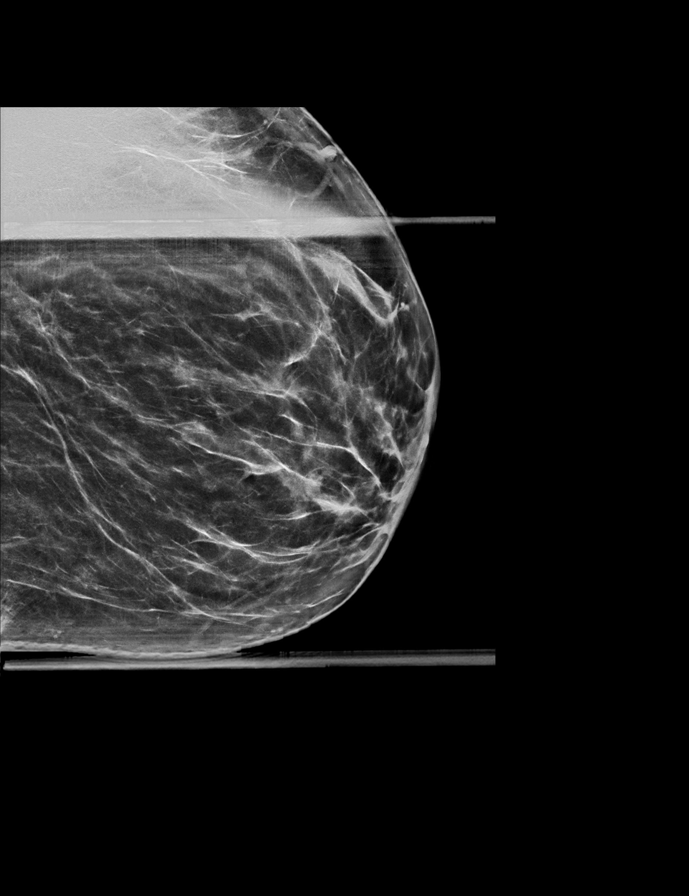

[L MLO tomo (1 of 2) · tomo slice 34/67.0]
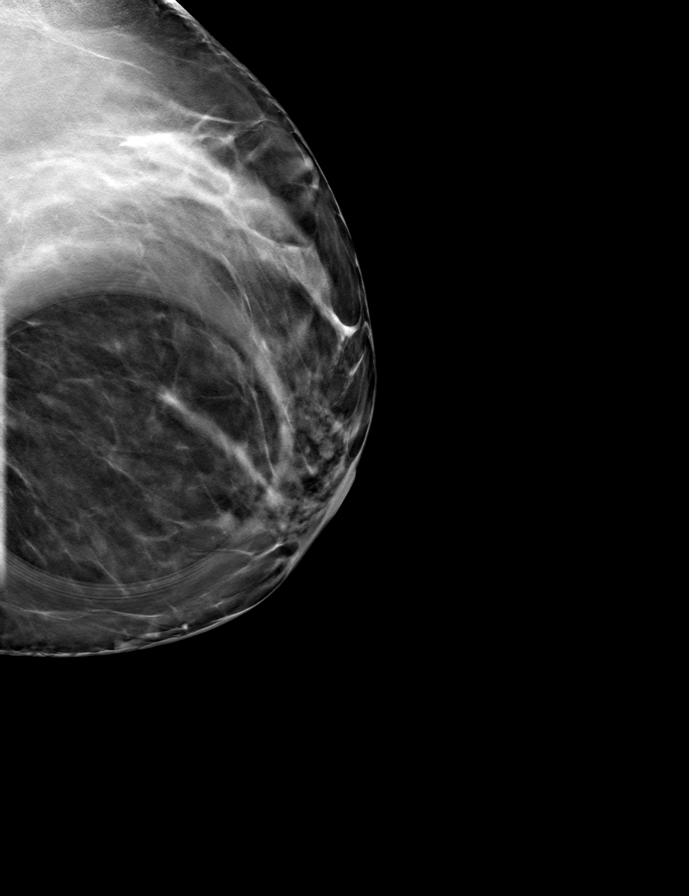

[L MLO tomo (2 of 2) · tomo slice 39/76.0]
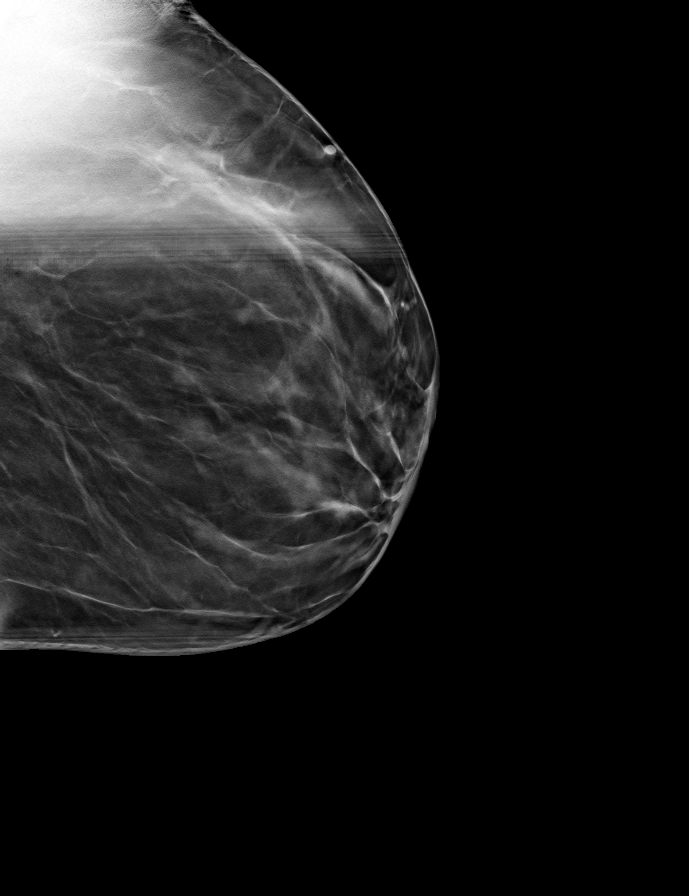

[L CC tomo · tomo slice 29/58.0]
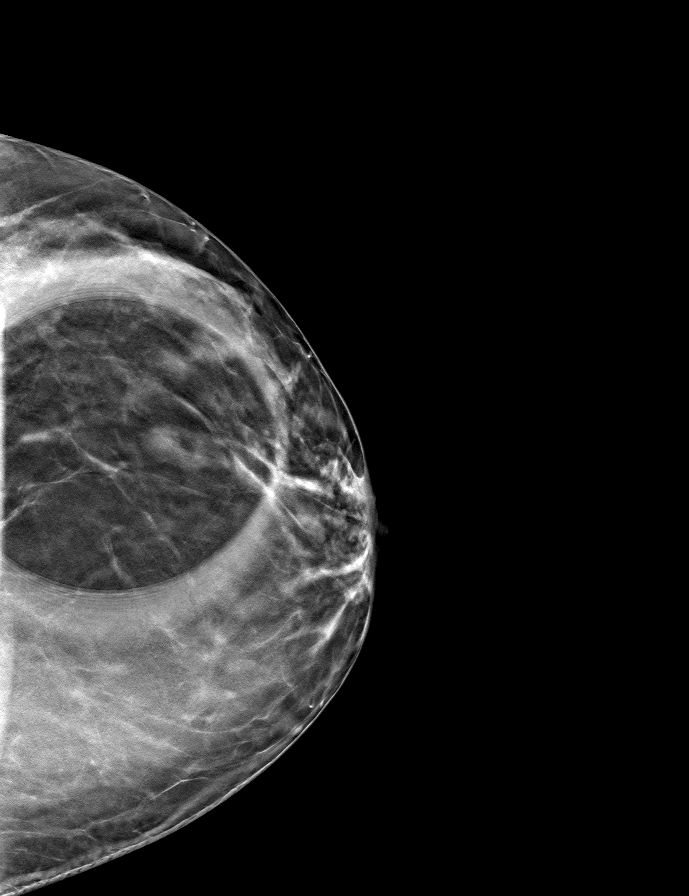

[9 of 21 positions shown; findings below may reference images not displayed]

ACR Breast Density Category b: There are scattered areas of
fibroglandular density.
FINDINGS: 2D and 3D spot compression views of the left breast demonstrate a
low-density circumscribed oval mass within the lower outer left
breast.

Targeted ultrasound is performed, showing a 10 x 5 x 5 mm benign
complicated cyst at the 5 o'clock position of the left breast 2 cm
from the nipple, corresponding to the screening study finding.
IMPRESSION: Benign cyst within the lower outer left breast corresponding to the
screening study finding.

RECOMMENDATION:
Bilateral screening mammograms in 1 year.

I have discussed the findings and recommendations with the patient.
Results were also provided in writing at the conclusion of the
visit. If applicable, a reminder letter will be sent to the patient
regarding the next appointment.

BI-RADS CATEGORY  2: Benign.

## 2019-11-18 IMAGING — US ULTRASOUND LEFT BREAST LIMITED
1 series · 8 of 8 positions shown · non-contrast
Comparison: 04/12/2017 baseline screening mammogram

CLINICAL DATA: 36-year-old female for further evaluation of
possible left breast mass on baseline screening mammogram.

EXAM:
2D DIGITAL DIAGNOSTIC LEFT MAMMOGRAM WITH ADJUNCT TOMO
ULTRASOUND LEFT BREAST

[Series 1: ultrasound left breast limited · 0.06mm/px · 8 of 8 slices shown]
[im 1/8]
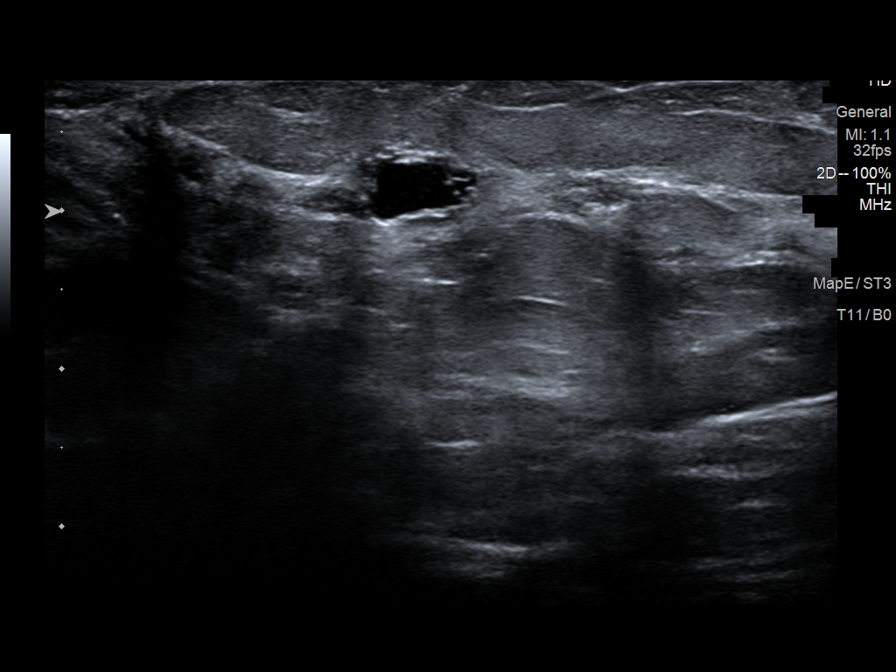
[im 2/8]
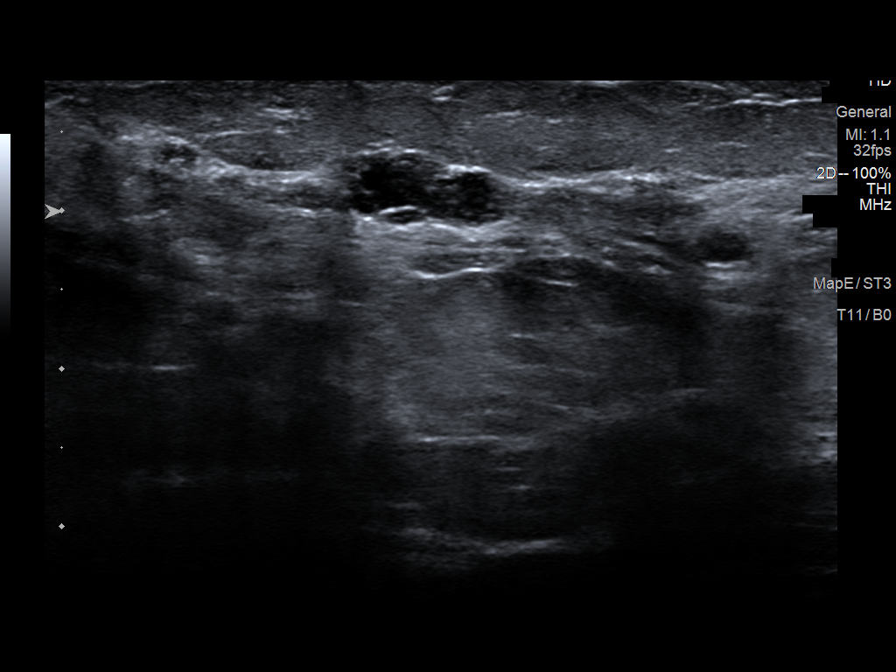
[im 3/8]
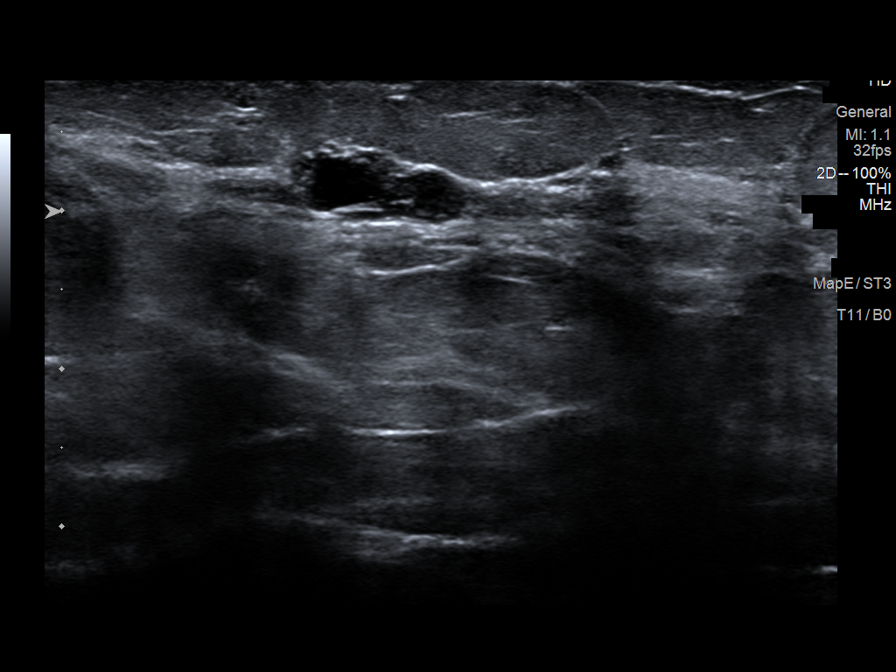
[im 4/8]
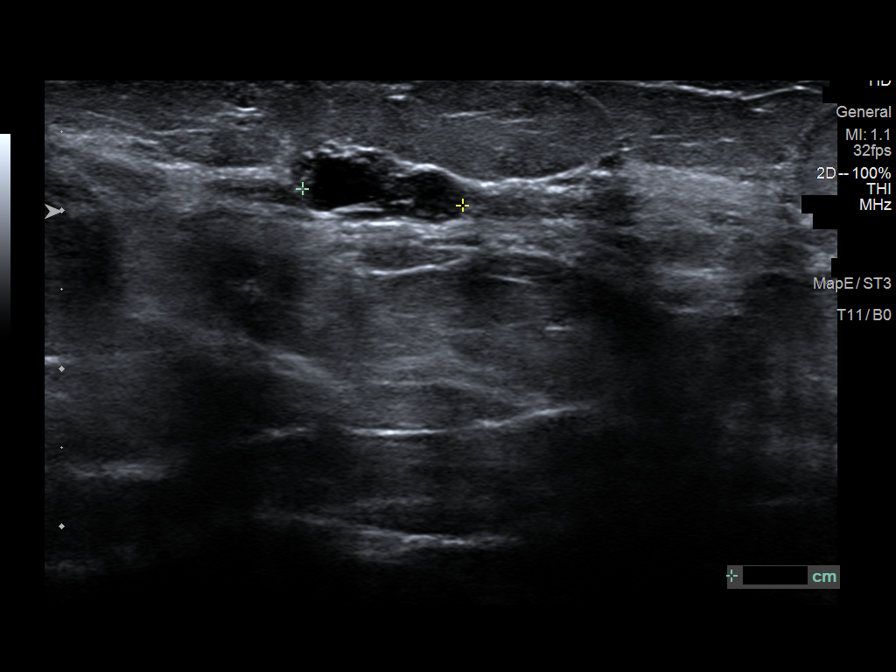
[im 5/8]
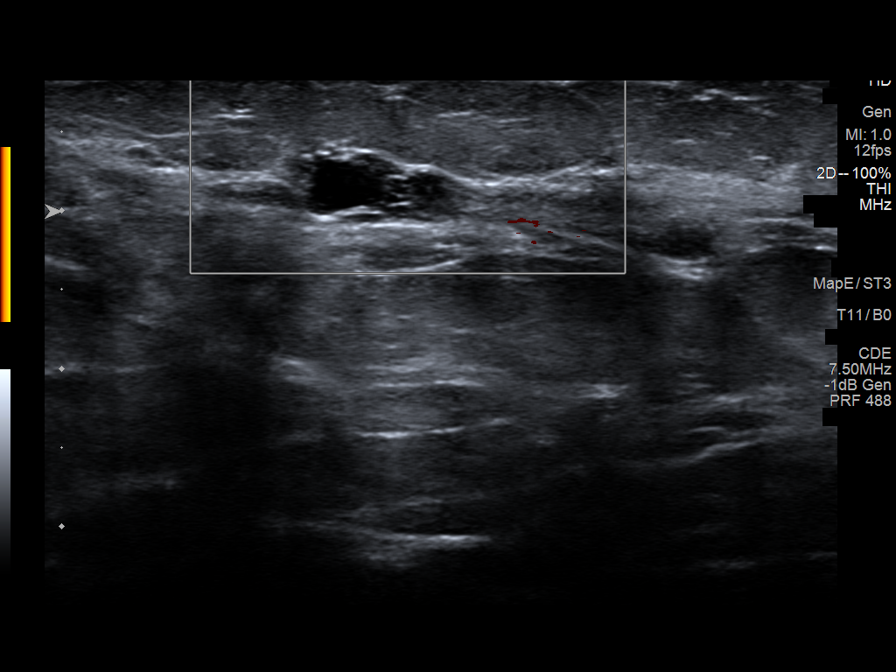
[im 6/8]
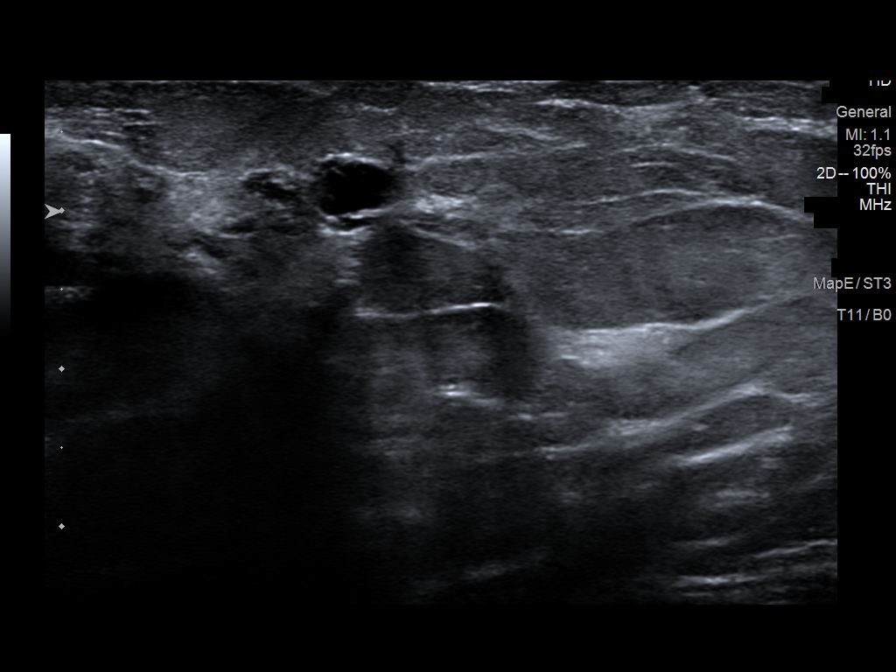
[im 7/8]
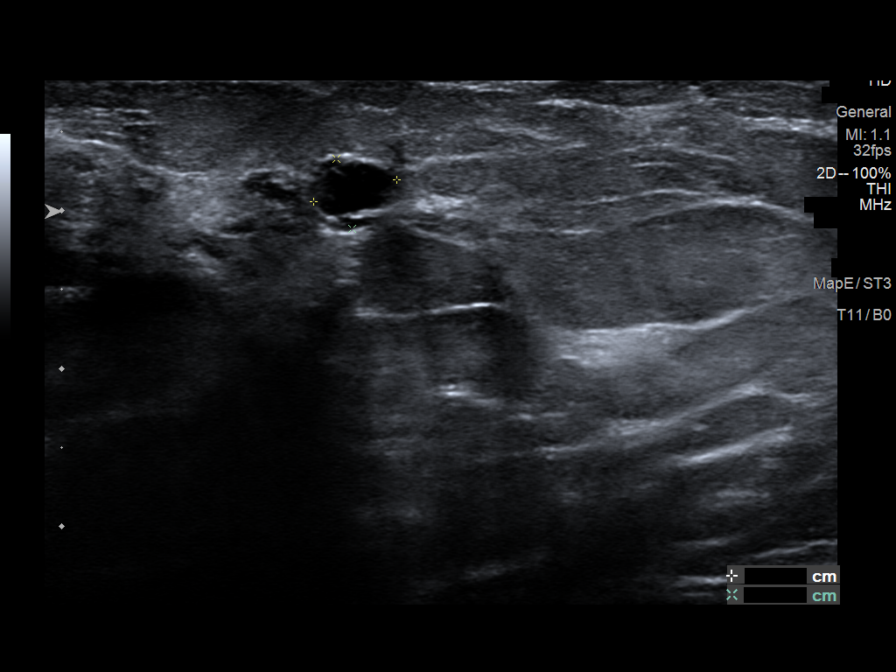
[im 8/8]
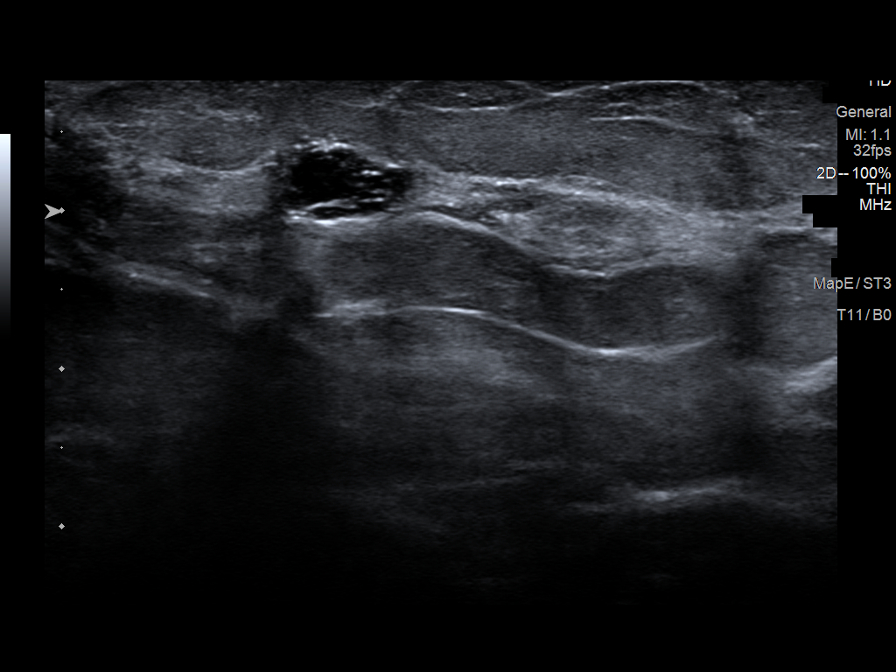

[8 of 8 positions shown; findings below may reference images not displayed]

ACR Breast Density Category b: There are scattered areas of
fibroglandular density.
FINDINGS: 2D and 3D spot compression views of the left breast demonstrate a
low-density circumscribed oval mass within the lower outer left
breast.

Targeted ultrasound is performed, showing a 10 x 5 x 5 mm benign
complicated cyst at the 5 o'clock position of the left breast 2 cm
from the nipple, corresponding to the screening study finding.
IMPRESSION: Benign cyst within the lower outer left breast corresponding to the
screening study finding.

RECOMMENDATION:
Bilateral screening mammograms in 1 year.

I have discussed the findings and recommendations with the patient.
Results were also provided in writing at the conclusion of the
visit. If applicable, a reminder letter will be sent to the patient
regarding the next appointment.

BI-RADS CATEGORY  2: Benign.

## 2019-11-22 ENCOUNTER — Other Ambulatory Visit (INDEPENDENT_AMBULATORY_CARE_PROVIDER_SITE_OTHER): Payer: BC Managed Care – PPO

## 2019-11-22 ENCOUNTER — Other Ambulatory Visit: Payer: Self-pay

## 2019-11-22 DIAGNOSIS — Z0000 Encounter for general adult medical examination without abnormal findings: Secondary | ICD-10-CM

## 2019-11-23 LAB — CBC WITH DIFFERENTIAL/PLATELET
Absolute Monocytes: 400 cells/uL (ref 200–950)
Basophils Absolute: 46 cells/uL (ref 0–200)
Basophils Relative: 0.6 %
Eosinophils Absolute: 216 cells/uL (ref 15–500)
Eosinophils Relative: 2.8 %
HCT: 42.4 % (ref 35.0–45.0)
Hemoglobin: 14.1 g/dL (ref 11.7–15.5)
Lymphs Abs: 1702 cells/uL (ref 850–3900)
MCH: 28.3 pg (ref 27.0–33.0)
MCHC: 33.3 g/dL (ref 32.0–36.0)
MCV: 85 fL (ref 80.0–100.0)
MPV: 9.4 fL (ref 7.5–12.5)
Monocytes Relative: 5.2 %
Neutro Abs: 5336 cells/uL (ref 1500–7800)
Neutrophils Relative %: 69.3 %
Platelets: 386 10*3/uL (ref 140–400)
RBC: 4.99 10*6/uL (ref 3.80–5.10)
RDW: 13.1 % (ref 11.0–15.0)
Total Lymphocyte: 22.1 %
WBC: 7.7 10*3/uL (ref 3.8–10.8)

## 2019-11-23 LAB — LIPID PANEL
Cholesterol: 186 mg/dL (ref <200)
HDL: 35 mg/dL — ABNORMAL LOW (ref >=50)
LDL Cholesterol (Calc): 127 mg/dL (calc) — ABNORMAL HIGH
Non-HDL Cholesterol (Calc): 151 mg/dL (calc) — ABNORMAL HIGH (ref <130)
Total CHOL/HDL Ratio: 5.3 (calc) — ABNORMAL HIGH (ref <5.0)
Triglycerides: 129 mg/dL (ref <150)

## 2019-11-23 LAB — BASIC METABOLIC PANEL
BUN/Creatinine Ratio: 8 (calc) (ref 6–22)
BUN: 6 mg/dL — ABNORMAL LOW (ref 7–25)
CO2: 26 mmol/L (ref 20–32)
Calcium: 9.2 mg/dL (ref 8.6–10.2)
Chloride: 106 mmol/L (ref 98–110)
Creat: 0.75 mg/dL (ref 0.50–1.10)
Glucose, Bld: 96 mg/dL (ref 65–99)
Potassium: 3.7 mmol/L (ref 3.5–5.3)
Sodium: 140 mmol/L (ref 135–146)

## 2019-11-23 LAB — VITAMIN B12: Vitamin B-12: 336 pg/mL (ref 200–1100)

## 2019-11-23 LAB — HEPATIC FUNCTION PANEL
AG Ratio: 1.9 (calc) (ref 1.0–2.5)
ALT: 12 U/L (ref 6–29)
AST: 12 U/L (ref 10–30)
Albumin: 4.2 g/dL (ref 3.6–5.1)
Alkaline phosphatase (APISO): 61 U/L (ref 31–125)
Bilirubin, Direct: 0.1 mg/dL (ref 0.0–0.2)
Globulin: 2.2 g/dL (calc) (ref 1.9–3.7)
Indirect Bilirubin: 0.5 mg/dL (calc) (ref 0.2–1.2)
Total Bilirubin: 0.6 mg/dL (ref 0.2–1.2)
Total Protein: 6.4 g/dL (ref 6.1–8.1)

## 2019-11-23 LAB — T3, FREE: T3, Free: 3.9 pg/mL (ref 2.3–4.2)

## 2019-11-23 LAB — TSH: TSH: 1.43 mIU/L

## 2019-11-23 LAB — T4, FREE: Free T4: 1.2 ng/dL (ref 0.8–1.8)

## 2019-11-26 DIAGNOSIS — Z419 Encounter for procedure for purposes other than remedying health state, unspecified: Secondary | ICD-10-CM | POA: Diagnosis not present

## 2019-12-12 ENCOUNTER — Encounter: Payer: Self-pay | Admitting: Family Medicine

## 2019-12-12 MED ORDER — TOPIRAMATE 50 MG PO TABS: 50.0000 mg | ORAL_TABLET | Freq: Two times a day (BID) | ORAL | 2 refills | Status: DC

## 2019-12-22 ENCOUNTER — Encounter: Payer: Self-pay | Admitting: Family Medicine

## 2019-12-25 NOTE — Telephone Encounter (Signed)
Last filled 05/07/2019 Last OV 11/17/2019  Ok to fill?

## 2019-12-26 MED ORDER — ALPRAZOLAM 1 MG PO TABS: 1.0000 mg | ORAL_TABLET | Freq: Two times a day (BID) | ORAL | 5 refills | Status: DC | PRN

## 2019-12-26 NOTE — Telephone Encounter (Signed)
Done

## 2019-12-27 DIAGNOSIS — Z419 Encounter for procedure for purposes other than remedying health state, unspecified: Secondary | ICD-10-CM | POA: Diagnosis not present

## 2020-01-07 ENCOUNTER — Other Ambulatory Visit: Payer: Self-pay | Admitting: Family Medicine

## 2020-01-26 DIAGNOSIS — Z419 Encounter for procedure for purposes other than remedying health state, unspecified: Secondary | ICD-10-CM | POA: Diagnosis not present

## 2020-01-31 ENCOUNTER — Other Ambulatory Visit: Payer: Self-pay | Admitting: Family Medicine

## 2020-02-19 ENCOUNTER — Other Ambulatory Visit: Payer: Self-pay | Admitting: Family Medicine

## 2020-02-19 MED ORDER — AMPHETAMINE-DEXTROAMPHET ER 20 MG PO CP24: 20.0000 mg | ORAL_CAPSULE | ORAL | 0 refills | Status: DC

## 2020-02-19 NOTE — Telephone Encounter (Signed)
Done

## 2020-02-19 NOTE — Telephone Encounter (Signed)
Patient called to clarify which Adderall she's needing the refill on, she says the extended release.

## 2020-02-19 NOTE — Telephone Encounter (Signed)
Pt is calling in needing a refill on a amphetamine-dextroamphetamine (ADDERALL) 20 MG due to her being out of it.  Pharm: [address] in [place]

## 2020-02-26 DIAGNOSIS — Z419 Encounter for procedure for purposes other than remedying health state, unspecified: Secondary | ICD-10-CM | POA: Diagnosis not present

## 2020-03-06 ENCOUNTER — Other Ambulatory Visit: Payer: Self-pay | Admitting: Family Medicine

## 2020-03-07 ENCOUNTER — Encounter: Payer: Self-pay | Admitting: Family Medicine

## 2020-03-07 ENCOUNTER — Other Ambulatory Visit: Payer: Self-pay | Admitting: Family Medicine

## 2020-03-07 NOTE — Telephone Encounter (Signed)
She already has refills until 05-20-20

## 2020-03-08 MED ORDER — AMPHETAMINE-DEXTROAMPHETAMINE 20 MG PO TABS: 20.0000 mg | ORAL_TABLET | Freq: Every day | ORAL | 0 refills | Status: DC

## 2020-03-08 NOTE — Telephone Encounter (Signed)
I sent in refills for the immediate release Adderall

## 2020-03-08 NOTE — Telephone Encounter (Signed)
Patient needs amphetamine-dextroamphetamine (ADDERALL) 20 MG tablet   [facility]:  [id number], [place] - [address] Phone:  [phone]  Fax:  [phone]

## 2020-03-27 DIAGNOSIS — Z419 Encounter for procedure for purposes other than remedying health state, unspecified: Secondary | ICD-10-CM | POA: Diagnosis not present

## 2020-04-27 DIAGNOSIS — Z419 Encounter for procedure for purposes other than remedying health state, unspecified: Secondary | ICD-10-CM | POA: Diagnosis not present

## 2020-05-18 ENCOUNTER — Other Ambulatory Visit: Payer: Self-pay | Admitting: Family Medicine

## 2020-05-21 NOTE — Telephone Encounter (Signed)
Last refill--05/08/20--30 tabs no refill

## 2020-05-21 NOTE — Telephone Encounter (Signed)
Last office visit--11/17/2019 Last refill--05/08/2020--30 tabs no refills

## 2020-05-22 MED ORDER — AMPHETAMINE-DEXTROAMPHET ER 20 MG PO CP24: 20.0000 mg | ORAL_CAPSULE | ORAL | 0 refills | Status: DC

## 2020-05-22 NOTE — Telephone Encounter (Signed)
Done

## 2020-05-28 ENCOUNTER — Other Ambulatory Visit: Payer: Self-pay | Admitting: Family Medicine

## 2020-05-28 DIAGNOSIS — Z419 Encounter for procedure for purposes other than remedying health state, unspecified: Secondary | ICD-10-CM | POA: Diagnosis not present

## 2020-06-07 ENCOUNTER — Ambulatory Visit: Payer: Medicaid Other | Admitting: Family Medicine

## 2020-06-12 ENCOUNTER — Other Ambulatory Visit: Payer: Self-pay

## 2020-06-12 ENCOUNTER — Ambulatory Visit (INDEPENDENT_AMBULATORY_CARE_PROVIDER_SITE_OTHER): Payer: Medicaid Other | Admitting: Family Medicine

## 2020-06-12 ENCOUNTER — Encounter: Payer: Self-pay | Admitting: Family Medicine

## 2020-06-12 VITALS — BP 118/88 | HR 66 | Temp 98.3°F | Ht 67.76 in | Wt 187.0 lb

## 2020-06-12 DIAGNOSIS — F411 Generalized anxiety disorder: Secondary | ICD-10-CM | POA: Diagnosis not present

## 2020-06-12 DIAGNOSIS — F901 Attention-deficit hyperactivity disorder, predominantly hyperactive type: Secondary | ICD-10-CM

## 2020-06-12 DIAGNOSIS — Z720 Tobacco use: Secondary | ICD-10-CM

## 2020-06-12 DIAGNOSIS — G43901 Migraine, unspecified, not intractable, with status migrainosus: Secondary | ICD-10-CM | POA: Diagnosis not present

## 2020-06-12 MED ORDER — AMPHETAMINE-DEXTROAMPHETAMINE 20 MG PO TABS: 20.0000 mg | ORAL_TABLET | Freq: Every day | ORAL | 0 refills | Status: DC

## 2020-06-12 MED ORDER — ALPRAZOLAM 1 MG PO TABS
1.0000 mg | ORAL_TABLET | Freq: Two times a day (BID) | ORAL | 5 refills | Status: DC | PRN
Start: 2020-06-12 — End: 2020-12-24

## 2020-06-12 MED ORDER — VARENICLINE TARTRATE 1 MG PO TABS: 1.0000 mg | ORAL_TABLET | Freq: Two times a day (BID) | ORAL | 1 refills | Status: DC

## 2020-06-12 MED ORDER — TOPIRAMATE 50 MG PO TABS: 150.0000 mg | ORAL_TABLET | Freq: Every day | ORAL | 5 refills | Status: DC

## 2020-06-12 MED ORDER — CHANTIX STARTING MONTH PAK 0.5 MG X 11 & 1 MG X 42 PO TABS: ORAL_TABLET | ORAL | 0 refills | Status: DC

## 2020-06-12 MED ORDER — SUMATRIPTAN SUCCINATE 100 MG PO TABS: ORAL_TABLET | ORAL | 11 refills | Status: DC

## 2020-06-12 NOTE — Progress Notes (Signed)
   Subjective:    Patient ID: Sheila [name], female    DOB: [birth year], 40 y.o.   MRN: [id number]  HPI Here to follow up on issues. Her migraines have become more frequent, especially around her menses. Her ADHD is stable. Her anxiety is stable. She also asks to try Chantix again to quit smoking.   Review of Systems  Constitutional: Negative.   Respiratory: Negative.   Cardiovascular: Negative.   Neurological: Positive for headaches.       Objective:   Physical Exam Constitutional:      Appearance: Normal appearance.  Cardiovascular:     Rate and Rhythm: Normal rate and regular rhythm.     Pulses: Normal pulses.     Heart sounds: Normal heart sounds.  Pulmonary:     Effort: Pulmonary effort is normal.     Breath sounds: Normal breath sounds.  Neurological:     Mental Status: She is alert.  Psychiatric:        Mood and Affect: Mood normal.        Thought Content: Thought content normal.           Assessment & Plan:  Her ADHD and anxiety are stable. We refilled Adderall and Xanax. For the migraines, we will increase her Topiramate to 150 mg at bedtime. She will take Chantix for 3 months.  Gershon Crane, MD

## 2020-06-22 ENCOUNTER — Other Ambulatory Visit: Payer: Self-pay | Admitting: Family Medicine

## 2020-06-25 DIAGNOSIS — Z419 Encounter for procedure for purposes other than remedying health state, unspecified: Secondary | ICD-10-CM | POA: Diagnosis not present

## 2020-06-26 ENCOUNTER — Telehealth: Payer: Self-pay | Admitting: Family Medicine

## 2020-06-26 NOTE — Telephone Encounter (Signed)
Please see what the status of this PA is

## 2020-06-26 NOTE — Telephone Encounter (Signed)
Patient calling checking the status of prior authorization for medication amphetamine-dextroamphetamine (ADDERALL XR) 20 MG 24 hr capsule   Patient states pharmacy faxed over prior authorization:  [id number], [place] - [address]  [address] [place] [address]  Phone:  [phone] Fax:  [phone]

## 2020-06-26 NOTE — Telephone Encounter (Signed)
Pt LOV was 06/12/2020 and last refill 06/12/2020 for 30 tablets please advise

## 2020-06-27 NOTE — Telephone Encounter (Signed)
Pt Prior Auth for Adderall has been sent to pt insurance for approval, pt  is aware that she will get a confirmation call when approved

## 2020-07-02 ENCOUNTER — Telehealth: Payer: Self-pay

## 2020-07-02 NOTE — Telephone Encounter (Signed)
Left a message for pt regarding her prior authorization approval for adderall, Advised pt to pick up medication from her pharmacy

## 2020-07-02 NOTE — Telephone Encounter (Signed)
Pt was notified of approval of her Adderall

## 2020-07-06 ENCOUNTER — Other Ambulatory Visit: Payer: Self-pay | Admitting: Family Medicine

## 2020-07-09 ENCOUNTER — Other Ambulatory Visit: Payer: Self-pay | Admitting: Family Medicine

## 2020-07-26 DIAGNOSIS — Z419 Encounter for procedure for purposes other than remedying health state, unspecified: Secondary | ICD-10-CM | POA: Diagnosis not present

## 2020-07-27 ENCOUNTER — Other Ambulatory Visit: Payer: Self-pay | Admitting: Family Medicine

## 2020-07-29 NOTE — Telephone Encounter (Signed)
Spoke with pt pharmacy regarding refills on Rx state that they have a continuing pak on file for pt, state to disregard the refill request

## 2020-08-25 DIAGNOSIS — Z419 Encounter for procedure for purposes other than remedying health state, unspecified: Secondary | ICD-10-CM | POA: Diagnosis not present

## 2020-08-28 ENCOUNTER — Other Ambulatory Visit: Payer: Self-pay | Admitting: Family Medicine

## 2020-08-28 NOTE — Telephone Encounter (Signed)
Last refill/office visit- 06/12/2020  No follow up visit has been scheduled

## 2020-08-29 ENCOUNTER — Other Ambulatory Visit: Payer: Self-pay | Admitting: Family Medicine

## 2020-08-30 NOTE — Telephone Encounter (Signed)
Last office visit- 06/12/20 Last refill- 07/20/20-30 cap, 0 refills  No future office visit scheduled

## 2020-09-02 DIAGNOSIS — Z1231 Encounter for screening mammogram for malignant neoplasm of breast: Secondary | ICD-10-CM | POA: Diagnosis not present

## 2020-09-02 DIAGNOSIS — Z6829 Body mass index (BMI) 29.0-29.9, adult: Secondary | ICD-10-CM | POA: Diagnosis not present

## 2020-09-02 DIAGNOSIS — Z0000 Encounter for general adult medical examination without abnormal findings: Secondary | ICD-10-CM | POA: Diagnosis not present

## 2020-09-02 DIAGNOSIS — Z124 Encounter for screening for malignant neoplasm of cervix: Secondary | ICD-10-CM | POA: Diagnosis not present

## 2020-09-02 LAB — HM MAMMOGRAPHY

## 2020-09-02 MED ORDER — AMPHETAMINE-DEXTROAMPHET ER 20 MG PO CP24: 20.0000 mg | ORAL_CAPSULE | ORAL | 0 refills | Status: DC

## 2020-09-02 NOTE — Telephone Encounter (Signed)
Done

## 2020-09-10 ENCOUNTER — Encounter: Payer: Self-pay | Admitting: Family Medicine

## 2020-09-25 DIAGNOSIS — Z419 Encounter for procedure for purposes other than remedying health state, unspecified: Secondary | ICD-10-CM | POA: Diagnosis not present

## 2020-10-08 ENCOUNTER — Other Ambulatory Visit: Payer: Self-pay | Admitting: Family Medicine

## 2020-10-08 NOTE — Telephone Encounter (Signed)
Last medication refill- 08/10/20 Last office visit 06/12/20  No future office scheduled

## 2020-10-10 NOTE — Telephone Encounter (Signed)
She already has refills until August 8

## 2020-10-16 NOTE — Telephone Encounter (Signed)
Pt is calling in stating that she is needing a refill on her regular Rx amphetamine-dextroamphetamine (ADDERALL) 20 MG.  [facility] [place] [place]  She do have enough of her XR one.

## 2020-10-17 ENCOUNTER — Telehealth: Payer: Self-pay | Admitting: Family Medicine

## 2020-10-17 MED ORDER — AMPHETAMINE-DEXTROAMPHETAMINE 20 MG PO TABS: 20.0000 mg | ORAL_TABLET | Freq: Every day | ORAL | 0 refills | Status: DC

## 2020-10-17 NOTE — Telephone Encounter (Signed)
Done

## 2020-10-17 NOTE — Telephone Encounter (Signed)
Pt call and stated she need a prior authorization sent for her Adderall.

## 2020-10-17 NOTE — Telephone Encounter (Signed)
See message.

## 2020-10-18 NOTE — Telephone Encounter (Signed)
Pt PA was submitted to insurance for approval, pt is aware that she will be notified when approved

## 2020-10-18 NOTE — Telephone Encounter (Signed)
Patient has medicaid can only be complete by faxing medicaid [place] form

## 2020-10-21 DIAGNOSIS — R1032 Left lower quadrant pain: Secondary | ICD-10-CM | POA: Diagnosis not present

## 2020-10-21 NOTE — Telephone Encounter (Signed)
Approvedon June 24 Approved. This drug is covered on the Preferred Drug List. It does not require prior approval.

## 2020-10-21 NOTE — Telephone Encounter (Signed)
Spoke with pt pharmacy state that pt picked up Rx on 10/18/2020

## 2020-10-22 ENCOUNTER — Telehealth: Payer: Self-pay

## 2020-10-22 NOTE — Telephone Encounter (Signed)
PA approved Key: [id number]

## 2020-10-25 DIAGNOSIS — Z419 Encounter for procedure for purposes other than remedying health state, unspecified: Secondary | ICD-10-CM | POA: Diagnosis not present

## 2020-11-09 ENCOUNTER — Other Ambulatory Visit: Payer: Self-pay | Admitting: Family Medicine

## 2020-11-11 ENCOUNTER — Other Ambulatory Visit: Payer: Self-pay | Admitting: Family Medicine

## 2020-11-18 ENCOUNTER — Other Ambulatory Visit: Payer: Self-pay | Admitting: Family Medicine

## 2020-11-19 MED ORDER — AMPHETAMINE-DEXTROAMPHET ER 20 MG PO CP24: 20.0000 mg | ORAL_CAPSULE | ORAL | 0 refills | Status: DC

## 2020-11-19 NOTE — Telephone Encounter (Signed)
Done

## 2020-11-25 DIAGNOSIS — Z419 Encounter for procedure for purposes other than remedying health state, unspecified: Secondary | ICD-10-CM | POA: Diagnosis not present

## 2020-11-26 ENCOUNTER — Other Ambulatory Visit: Payer: Self-pay | Admitting: Family Medicine

## 2020-12-24 ENCOUNTER — Telehealth: Payer: Self-pay | Admitting: Family Medicine

## 2020-12-24 MED ORDER — AMPHETAMINE-DEXTROAMPHETAMINE 20 MG PO TABS: 20.0000 mg | ORAL_TABLET | Freq: Every day | ORAL | 0 refills | Status: DC

## 2020-12-24 MED ORDER — ALPRAZOLAM 1 MG PO TABS: 1.0000 mg | ORAL_TABLET | Freq: Two times a day (BID) | ORAL | 5 refills | Status: DC | PRN

## 2020-12-24 MED ORDER — SUMATRIPTAN SUCCINATE 100 MG PO TABS: ORAL_TABLET | ORAL | 11 refills | Status: DC

## 2020-12-24 NOTE — Telephone Encounter (Signed)
Done

## 2020-12-24 NOTE — Telephone Encounter (Signed)
Pt call and stated she need refills on amphetamine-dextroamphetamine (ADDERALL) 20 MG tablet ,ALPRAZolam (XANAX) 1 MG tablet and SUMAtriptan (IMITREX) 100 MG tablet  [id number] [place], [place] - [address] AT [facility] OF [facility] [address] Phone:  [phone]  Fax:  [phone]

## 2020-12-24 NOTE — Telephone Encounter (Signed)
Spoke with patient to inform medication has been sent to pharmacy

## 2020-12-24 NOTE — Telephone Encounter (Signed)
[facility] [address] Last office visit- 06/12/20

## 2020-12-26 DIAGNOSIS — Z419 Encounter for procedure for purposes other than remedying health state, unspecified: Secondary | ICD-10-CM | POA: Diagnosis not present

## 2021-01-21 DIAGNOSIS — Z113 Encounter for screening for infections with a predominantly sexual mode of transmission: Secondary | ICD-10-CM | POA: Diagnosis not present

## 2021-01-25 DIAGNOSIS — Z419 Encounter for procedure for purposes other than remedying health state, unspecified: Secondary | ICD-10-CM | POA: Diagnosis not present

## 2021-02-06 ENCOUNTER — Encounter: Payer: Self-pay | Admitting: Family Medicine

## 2021-02-06 MED ORDER — AMPHETAMINE-DEXTROAMPHET ER 20 MG PO CP24: 20.0000 mg | ORAL_CAPSULE | ORAL | 0 refills | Status: DC

## 2021-02-06 MED ORDER — AMPHETAMINE-DEXTROAMPHET ER 20 MG PO CP24
20.0000 mg | ORAL_CAPSULE | ORAL | 0 refills | Status: DC
Start: 2021-04-08 — End: 2021-05-28

## 2021-02-06 NOTE — Telephone Encounter (Signed)
Done

## 2021-02-25 DIAGNOSIS — Z419 Encounter for procedure for purposes other than remedying health state, unspecified: Secondary | ICD-10-CM | POA: Diagnosis not present

## 2021-02-26 ENCOUNTER — Other Ambulatory Visit: Payer: Self-pay

## 2021-02-26 MED ORDER — TOPIRAMATE 50 MG PO TABS: 150.0000 mg | ORAL_TABLET | Freq: Every day | ORAL | 1 refills | Status: DC

## 2021-03-27 DIAGNOSIS — Z419 Encounter for procedure for purposes other than remedying health state, unspecified: Secondary | ICD-10-CM | POA: Diagnosis not present

## 2021-04-27 DIAGNOSIS — Z419 Encounter for procedure for purposes other than remedying health state, unspecified: Secondary | ICD-10-CM | POA: Diagnosis not present

## 2021-05-24 ENCOUNTER — Other Ambulatory Visit: Payer: Self-pay | Admitting: Family Medicine

## 2021-05-28 ENCOUNTER — Other Ambulatory Visit: Payer: Self-pay | Admitting: Family Medicine

## 2021-05-28 DIAGNOSIS — Z419 Encounter for procedure for purposes other than remedying health state, unspecified: Secondary | ICD-10-CM | POA: Diagnosis not present

## 2021-05-29 MED ORDER — AMPHETAMINE-DEXTROAMPHET ER 20 MG PO CP24: 20.0000 mg | ORAL_CAPSULE | ORAL | 0 refills | Status: DC

## 2021-05-29 MED ORDER — AMPHETAMINE-DEXTROAMPHETAMINE 20 MG PO TABS: 20.0000 mg | ORAL_TABLET | Freq: Every day | ORAL | 0 refills | Status: DC

## 2021-05-29 NOTE — Telephone Encounter (Signed)
Last refill- 04/08/2021--30 capsules Last OV- 06/12/20  No future OV scheduled.   Can this patient receive a refill?

## 2021-05-29 NOTE — Telephone Encounter (Signed)
Last refill- 02/23/21--30 capsules, 0 refills Last OV- 06/12/20  No future OV scheduled.   Can this patient receive a refill?

## 2021-06-23 ENCOUNTER — Other Ambulatory Visit: Payer: Self-pay | Admitting: Family Medicine

## 2021-06-24 NOTE — Telephone Encounter (Signed)
Last OV- 06/12/20 No future OV scheduled

## 2021-06-25 DIAGNOSIS — Z419 Encounter for procedure for purposes other than remedying health state, unspecified: Secondary | ICD-10-CM | POA: Diagnosis not present

## 2021-07-26 DIAGNOSIS — Z419 Encounter for procedure for purposes other than remedying health state, unspecified: Secondary | ICD-10-CM | POA: Diagnosis not present

## 2021-08-25 DIAGNOSIS — Z419 Encounter for procedure for purposes other than remedying health state, unspecified: Secondary | ICD-10-CM | POA: Diagnosis not present

## 2021-09-25 DIAGNOSIS — Z419 Encounter for procedure for purposes other than remedying health state, unspecified: Secondary | ICD-10-CM | POA: Diagnosis not present

## 2021-09-29 DIAGNOSIS — Z6830 Body mass index (BMI) 30.0-30.9, adult: Secondary | ICD-10-CM | POA: Diagnosis not present

## 2021-09-29 DIAGNOSIS — Z01419 Encounter for gynecological examination (general) (routine) without abnormal findings: Secondary | ICD-10-CM | POA: Diagnosis not present

## 2021-09-29 DIAGNOSIS — Z1231 Encounter for screening mammogram for malignant neoplasm of breast: Secondary | ICD-10-CM | POA: Diagnosis not present

## 2021-09-29 LAB — HM MAMMOGRAPHY

## 2021-09-30 ENCOUNTER — Other Ambulatory Visit: Payer: Self-pay | Admitting: Family Medicine

## 2021-09-30 MED ORDER — AMPHETAMINE-DEXTROAMPHETAMINE 20 MG PO TABS: 20.0000 mg | ORAL_TABLET | Freq: Every day | ORAL | 0 refills | Status: DC

## 2021-09-30 NOTE — Telephone Encounter (Signed)
Done

## 2021-09-30 NOTE — Telephone Encounter (Signed)
Last OV 06/12/2020 Last 3 Rxs given: 05/29/21, 06/26/21 and 07/28/2021

## 2021-10-02 ENCOUNTER — Encounter: Payer: Self-pay | Admitting: Family Medicine

## 2021-10-02 NOTE — Progress Notes (Signed)
The procedure was done at [place].

## 2021-10-25 DIAGNOSIS — Z419 Encounter for procedure for purposes other than remedying health state, unspecified: Secondary | ICD-10-CM | POA: Diagnosis not present

## 2021-10-29 ENCOUNTER — Encounter: Payer: Self-pay | Admitting: Family Medicine

## 2021-10-29 ENCOUNTER — Ambulatory Visit (INDEPENDENT_AMBULATORY_CARE_PROVIDER_SITE_OTHER): Payer: BLUE CROSS/BLUE SHIELD | Admitting: Family Medicine

## 2021-10-29 VITALS — BP 110/78 | HR 92 | Temp 98.1°F | Ht 67.0 in | Wt 186.4 lb

## 2021-10-29 DIAGNOSIS — G250 Essential tremor: Secondary | ICD-10-CM | POA: Diagnosis not present

## 2021-10-29 DIAGNOSIS — Z0000 Encounter for general adult medical examination without abnormal findings: Secondary | ICD-10-CM | POA: Diagnosis not present

## 2021-10-29 LAB — BASIC METABOLIC PANEL
BUN: 8 mg/dL (ref 6–23)
CO2: 22 mEq/L (ref 19–32)
Calcium: 8.9 mg/dL (ref 8.4–10.5)
Chloride: 105 mEq/L (ref 96–112)
Creatinine, Ser: 0.84 mg/dL (ref 0.40–1.20)
GFR: 85.37 mL/min (ref >60.00)
Glucose, Bld: 72 mg/dL (ref 70–99)
Potassium: 3.3 mEq/L — ABNORMAL LOW (ref 3.5–5.1)
Sodium: 138 mEq/L (ref 135–145)

## 2021-10-29 LAB — CBC WITH DIFFERENTIAL/PLATELET
Basophils Absolute: 0 10*3/uL (ref 0.0–0.1)
Basophils Relative: 0.5 % (ref 0.0–3.0)
Eosinophils Absolute: 0.3 10*3/uL (ref 0.0–0.7)
Eosinophils Relative: 4 % (ref 0.0–5.0)
HCT: 41.8 % (ref 36.0–46.0)
Hemoglobin: 14 g/dL (ref 12.0–15.0)
Lymphocytes Relative: 21.7 % (ref 12.0–46.0)
Lymphs Abs: 1.5 10*3/uL (ref 0.7–4.0)
MCHC: 33.4 g/dL (ref 30.0–36.0)
MCV: 84 fl (ref 78.0–100.0)
Monocytes Absolute: 0.7 10*3/uL (ref 0.1–1.0)
Monocytes Relative: 9.6 % (ref 3.0–12.0)
Neutro Abs: 4.5 10*3/uL (ref 1.4–7.7)
Neutrophils Relative %: 64.2 % (ref 43.0–77.0)
Platelets: 282 10*3/uL (ref 150.0–400.0)
RBC: 4.98 Mil/uL (ref 3.87–5.11)
RDW: 13.8 % (ref 11.5–15.5)
WBC: 7 10*3/uL (ref 4.0–10.5)

## 2021-10-29 LAB — HEPATIC FUNCTION PANEL
ALT: 61 U/L — ABNORMAL HIGH (ref 0–35)
AST: 31 U/L (ref 0–37)
Albumin: 4.2 g/dL (ref 3.5–5.2)
Alkaline Phosphatase: 74 U/L (ref 39–117)
Bilirubin, Direct: 0.1 mg/dL (ref 0.0–0.3)
Total Bilirubin: 0.3 mg/dL (ref 0.2–1.2)
Total Protein: 6.7 g/dL (ref 6.0–8.3)

## 2021-10-29 LAB — LIPID PANEL
Cholesterol: 179 mg/dL (ref 0–200)
HDL: 41.6 mg/dL (ref >39.00)
LDL Cholesterol: 115 mg/dL — ABNORMAL HIGH (ref 0–99)
NonHDL: 137.46
Total CHOL/HDL Ratio: 4
Triglycerides: 110 mg/dL (ref 0.0–149.0)
VLDL: 22 mg/dL (ref 0.0–40.0)

## 2021-10-29 LAB — HEMOGLOBIN A1C: Hgb A1c MFr Bld: 6 % (ref 4.6–6.5)

## 2021-10-29 LAB — VITAMIN B12: Vitamin B-12: >1504 pg/mL — ABNORMAL HIGH (ref 211–911)

## 2021-10-29 LAB — TSH: TSH: 1.04 u[IU]/mL (ref 0.35–5.50)

## 2021-10-29 MED ORDER — TOPIRAMATE 50 MG PO TABS: 150.0000 mg | ORAL_TABLET | Freq: Every day | ORAL | 1 refills | Status: DC

## 2021-10-29 NOTE — Progress Notes (Signed)
Subjective:    Patient ID: Sheila [name], female    DOB: 11-12-1980, 41 y.o.   MRN: [id number]  HPI Here for a well exam. She feels great, but she does mention some slight tremors that come and go. She and her family first noticed these about a year ago, and they have not seemed to worsen at all. They mostly involve the hands, but sometimes her head will be involved. These are more noticeable when she tired. They do not inhibit her in any way (no trouble with writing or eating). No family hx of tremors. Otherwise her migraines are rare, and when she does get one they respond well to Sumatriptan. The ADHD is stable.    Review of Systems  Constitutional: Negative.   HENT: Negative.    Eyes: Negative.   Respiratory: Negative.    Cardiovascular: Negative.   Gastrointestinal: Negative.   Genitourinary:  Negative for decreased urine volume, difficulty urinating, dyspareunia, dysuria, enuresis, flank pain, frequency, hematuria, pelvic pain and urgency.  Musculoskeletal: Negative.   Skin: Negative.   Neurological:  Positive for tremors. Negative for headaches.  Psychiatric/Behavioral: Negative.         Objective:   Physical Exam Constitutional:      General: She is not in acute distress.    Appearance: Normal appearance. She is well-developed.  HENT:     Head: Normocephalic and atraumatic.     Right Ear: External ear normal.     Left Ear: External ear normal.     Nose: Nose normal.     Mouth/Throat:     Pharynx: No oropharyngeal exudate.  Eyes:     General: No scleral icterus.    Conjunctiva/sclera: Conjunctivae normal.     Pupils: Pupils are equal, round, and reactive to light.  Neck:     Thyroid: No thyromegaly.     Vascular: No JVD.  Cardiovascular:     Rate and Rhythm: Normal rate and regular rhythm.     Heart sounds: Normal heart sounds. No murmur heard.    No friction rub. No gallop.  Pulmonary:     Effort: Pulmonary effort is normal. No respiratory distress.      Breath sounds: Normal breath sounds. No wheezing or rales.  Chest:     Chest wall: No tenderness.  Abdominal:     General: Bowel sounds are normal. There is no distension.     Palpations: Abdomen is soft. There is no mass.     Tenderness: There is no abdominal tenderness. There is no guarding or rebound.  Musculoskeletal:        General: No tenderness. Normal range of motion.     Cervical back: Normal range of motion and neck supple.  Lymphadenopathy:     Cervical: No cervical adenopathy.  Skin:    General: Skin is warm and dry.     Findings: No erythema or rash.  Neurological:     Mental Status: She is alert and oriented to person, place, and time.     Cranial Nerves: No cranial nerve deficit.     Motor: No abnormal muscle tone.     Coordination: Coordination normal.     Deep Tendon Reflexes: Reflexes are normal and symmetric. Reflexes normal.     Comments: There is a very slight resting tremor in the right hand   Psychiatric:        Behavior: Behavior normal.        Thought Content: Thought content normal.  Judgment: Judgment normal.           Assessment & Plan:  Well exam. We discussed diet and exercise. Get fasting labs. The tremors appear to be benign essential tremors, and we agreed to simply observe them for now. She will let us know if they worsen in any way. Gershon Crane, MD

## 2021-10-31 ENCOUNTER — Encounter: Payer: Self-pay | Admitting: Family Medicine

## 2021-10-31 MED ORDER — AMPHETAMINE-DEXTROAMPHET ER 20 MG PO CP24: 20.0000 mg | ORAL_CAPSULE | ORAL | 0 refills | Status: DC

## 2021-10-31 NOTE — Telephone Encounter (Signed)
Done

## 2021-11-03 ENCOUNTER — Other Ambulatory Visit: Payer: Self-pay

## 2021-11-03 ENCOUNTER — Telehealth: Payer: Self-pay

## 2021-11-03 MED ORDER — POTASSIUM CHLORIDE CRYS ER 10 MEQ PO TBCR: 10.0000 meq | EXTENDED_RELEASE_TABLET | Freq: Every day | ORAL | 3 refills | Status: DC

## 2021-11-03 NOTE — Telephone Encounter (Signed)
Reviewed lab results with pt, verbalized understanding

## 2021-11-25 DIAGNOSIS — Z419 Encounter for procedure for purposes other than remedying health state, unspecified: Secondary | ICD-10-CM | POA: Diagnosis not present

## 2021-12-25 ENCOUNTER — Other Ambulatory Visit: Payer: Self-pay | Admitting: Family Medicine

## 2021-12-26 DIAGNOSIS — Z419 Encounter for procedure for purposes other than remedying health state, unspecified: Secondary | ICD-10-CM | POA: Diagnosis not present

## 2021-12-26 NOTE — Telephone Encounter (Signed)
Refilled alprazolam once in primary provider's absence  Kristian Covey MD [facility]

## 2021-12-26 NOTE — Telephone Encounter (Signed)
Dr. Claris Che patient.  Last OV CPE- 10/29/21 Last refill-06/24/21--60 tabs, 5 refills  No future OV scheduled.

## 2022-01-05 ENCOUNTER — Other Ambulatory Visit: Payer: Self-pay | Admitting: Family Medicine

## 2022-01-05 NOTE — Telephone Encounter (Signed)
Pt LOV was 10/29/2021 Last refill for Adderall 20 mg Tablets was 11/30/2021 Pt requests for refill, please advise

## 2022-01-06 ENCOUNTER — Telehealth: Payer: Self-pay | Admitting: Family Medicine

## 2022-01-06 MED ORDER — AMPHETAMINE-DEXTROAMPHETAMINE 20 MG PO TABS: 20.0000 mg | ORAL_TABLET | Freq: Every day | ORAL | 0 refills | Status: DC

## 2022-01-06 MED ORDER — AMPHETAMINE-DEXTROAMPHETAMINE 20 MG PO TABS
20.0000 mg | ORAL_TABLET | Freq: Every day | ORAL | 0 refills | Status: DC
Start: 2022-03-08 — End: 2022-05-20

## 2022-01-06 NOTE — Telephone Encounter (Signed)
Done

## 2022-01-06 NOTE — Telephone Encounter (Signed)
I did this earlier today

## 2022-01-06 NOTE — Telephone Encounter (Signed)
Pt called to request a refill of the following:  amphetamine-dextroamphetamine (ADDERALL) 20 MG tablet  LOV:  10/29/21 = CPE  Pt states she is completely out.  [id number] [place], [place] - [address] AT [facility] OF [facility] [address] Phone:  [phone]  Fax:  [phone]      Please advise.

## 2022-01-23 ENCOUNTER — Other Ambulatory Visit: Payer: Self-pay | Admitting: Family Medicine

## 2022-01-23 NOTE — Telephone Encounter (Signed)
Last refill-12-26-21--60 tabs, 0 refills Last CPE-10/29/21  No future OV scheduled.

## 2022-01-23 NOTE — Telephone Encounter (Signed)
Pt LOV was on 10/29/2021 Last refill was done on 12/26/21 Please advise

## 2022-01-23 NOTE — Telephone Encounter (Signed)
Refill requesting ALPRAZolam (XANAX) 1 MG tablet

## 2022-01-25 ENCOUNTER — Other Ambulatory Visit: Payer: Self-pay | Admitting: Family Medicine

## 2022-01-25 DIAGNOSIS — Z419 Encounter for procedure for purposes other than remedying health state, unspecified: Secondary | ICD-10-CM | POA: Diagnosis not present

## 2022-01-26 ENCOUNTER — Other Ambulatory Visit: Payer: Self-pay | Admitting: Family Medicine

## 2022-01-26 ENCOUNTER — Encounter: Payer: Self-pay | Admitting: Family Medicine

## 2022-01-26 MED ORDER — ALPRAZOLAM 1 MG PO TABS: ORAL_TABLET | ORAL | 5 refills | Status: DC

## 2022-01-26 NOTE — Telephone Encounter (Signed)
I sent in the refills  

## 2022-02-12 ENCOUNTER — Other Ambulatory Visit: Payer: Self-pay | Admitting: Family Medicine

## 2022-02-13 MED ORDER — AMPHETAMINE-DEXTROAMPHET ER 20 MG PO CP24: 20.0000 mg | ORAL_CAPSULE | ORAL | 0 refills | Status: DC

## 2022-02-13 NOTE — Telephone Encounter (Signed)
Last OV CPE-10/29/21 Last refill-01/01/22--30 capsules, 0 refill  No future OV scheduled.

## 2022-02-13 NOTE — Telephone Encounter (Signed)
Done

## 2022-02-25 DIAGNOSIS — Z419 Encounter for procedure for purposes other than remedying health state, unspecified: Secondary | ICD-10-CM | POA: Diagnosis not present

## 2022-03-27 DIAGNOSIS — Z419 Encounter for procedure for purposes other than remedying health state, unspecified: Secondary | ICD-10-CM | POA: Diagnosis not present

## 2022-04-09 ENCOUNTER — Other Ambulatory Visit: Payer: Self-pay | Admitting: Family Medicine

## 2022-04-27 DIAGNOSIS — Z419 Encounter for procedure for purposes other than remedying health state, unspecified: Secondary | ICD-10-CM | POA: Diagnosis not present

## 2022-04-28 ENCOUNTER — Other Ambulatory Visit: Payer: Self-pay | Admitting: Family Medicine

## 2022-04-28 NOTE — Telephone Encounter (Signed)
Rx filled on 04/15/22 too soon to refill

## 2022-04-29 NOTE — Telephone Encounter (Signed)
Last OV CPE-10/29/21 Last refill-04/15/22--30 tabs, 0 refills  No future Ov scheduled.

## 2022-05-13 ENCOUNTER — Encounter: Payer: Self-pay | Admitting: Family Medicine

## 2022-05-13 ENCOUNTER — Ambulatory Visit (INDEPENDENT_AMBULATORY_CARE_PROVIDER_SITE_OTHER): Payer: Medicaid Other | Admitting: Family Medicine

## 2022-05-13 VITALS — BP 102/74 | HR 58 | Temp 98.2°F | Wt 178.8 lb

## 2022-05-13 DIAGNOSIS — F411 Generalized anxiety disorder: Secondary | ICD-10-CM | POA: Diagnosis not present

## 2022-05-13 DIAGNOSIS — G4700 Insomnia, unspecified: Secondary | ICD-10-CM | POA: Diagnosis not present

## 2022-05-13 DIAGNOSIS — F901 Attention-deficit hyperactivity disorder, predominantly hyperactive type: Secondary | ICD-10-CM | POA: Diagnosis not present

## 2022-05-13 MED ORDER — TEMAZEPAM 30 MG PO CAPS: 30.0000 mg | ORAL_CAPSULE | Freq: Every evening | ORAL | 0 refills | Status: DC | PRN

## 2022-05-13 NOTE — Progress Notes (Signed)
   Subjective:    Patient ID: Sheila [name], female    DOB: 04-Nov-1980, 42 y.o.   MRN: [id number]  HPI Here asking for help with sleep. She says she has been a good sleepr all her life until about 7 months ago. Then she began to have trouble which has gotten worse. She falls asleep easy but she wakes up during the night and cannot go back to sleep. She has been taking Adderall at her current dose for years, and this has never affected her sleep she takes the afternoon dose of immediate release Adderall at 1 o'clock every day. She denies any particular stressors right now either at home or at work. She has tried taking melatonin with no relief. She has taken 2 Xanax pills at bedtime for years.    Review of Systems  Constitutional: Negative.   Respiratory: Negative.    Cardiovascular: Negative.   Psychiatric/Behavioral:  Positive for sleep disturbance. Negative for agitation, behavioral problems, confusion, decreased concentration, dysphoric mood and hallucinations. The patient is not nervous/anxious.        Objective:   Physical Exam Constitutional:      General: She is not in acute distress.    Appearance: Normal appearance.  Cardiovascular:     Rate and Rhythm: Normal rate and regular rhythm.     Pulses: Normal pulses.     Heart sounds: Normal heart sounds.  Pulmonary:     Effort: Pulmonary effort is normal.     Breath sounds: Normal breath sounds.  Neurological:     General: No focal deficit present.     Mental Status: She is alert and oriented to person, place, and time.  Psychiatric:        Mood and Affect: Mood normal.        Behavior: Behavior normal.        Thought Content: Thought content normal.           Assessment & Plan:  Insomnia. She will try Temazepam 30 mg at bedtime. She plans to take this only 2 or 3 nights a week. Report back in 4 weeks.  Alysia Penna, MD

## 2022-05-19 ENCOUNTER — Telehealth: Payer: Self-pay | Admitting: Family Medicine

## 2022-05-19 NOTE — Telephone Encounter (Signed)
Pt called to say she is completely out of the:  amphetamine-dextroamphetamine (ADDERALL XR) 20 MG 24 hr capsule (Expired) And the: amphetamine-dextroamphetamine (ADDERALL) 20 MG tablet (Expired)  LOV:  05/13/22  Please advise. [id number] [place], [place] AT [address] [place] Phone: [phone]  Fax: [phone]

## 2022-05-19 NOTE — Telephone Encounter (Signed)
Pharmacy updated.

## 2022-05-20 MED ORDER — AMPHETAMINE-DEXTROAMPHET ER 20 MG PO CP24: 20.0000 mg | ORAL_CAPSULE | ORAL | 0 refills | Status: DC

## 2022-05-20 MED ORDER — AMPHETAMINE-DEXTROAMPHETAMINE 20 MG PO TABS: 20.0000 mg | ORAL_TABLET | Freq: Every day | ORAL | 0 refills | Status: DC

## 2022-05-20 NOTE — Telephone Encounter (Signed)
Done

## 2022-05-20 NOTE — Addendum Note (Signed)
Addended by: Alysia Penna A on: 05/20/2022 08:18 AM   Modules accepted: Orders

## 2022-05-27 ENCOUNTER — Other Ambulatory Visit: Payer: Self-pay | Admitting: Family Medicine

## 2022-05-28 DIAGNOSIS — Z419 Encounter for procedure for purposes other than remedying health state, unspecified: Secondary | ICD-10-CM | POA: Diagnosis not present

## 2022-05-29 ENCOUNTER — Ambulatory Visit (INDEPENDENT_AMBULATORY_CARE_PROVIDER_SITE_OTHER): Payer: Medicaid Other | Admitting: Family Medicine

## 2022-05-29 ENCOUNTER — Encounter: Payer: Self-pay | Admitting: Family Medicine

## 2022-05-29 VITALS — BP 96/60 | HR 61 | Temp 97.6°F | Wt 180.0 lb

## 2022-05-29 DIAGNOSIS — J029 Acute pharyngitis, unspecified: Secondary | ICD-10-CM | POA: Diagnosis not present

## 2022-05-29 DIAGNOSIS — B349 Viral infection, unspecified: Secondary | ICD-10-CM | POA: Diagnosis not present

## 2022-05-29 DIAGNOSIS — Z20822 Contact with and (suspected) exposure to covid-19: Secondary | ICD-10-CM

## 2022-05-29 LAB — POCT RAPID STREP A (OFFICE): Rapid Strep A Screen: POSITIVE — AB

## 2022-05-29 LAB — POCT INFLUENZA A/B
Influenza A, POC: NEGATIVE
Influenza B, POC: NEGATIVE

## 2022-05-29 LAB — POC COVID19 BINAXNOW: SARS Coronavirus 2 Ag: NEGATIVE

## 2022-05-29 MED ORDER — NIRMATRELVIR/RITONAVIR (PAXLOVID)TABLET: 3.0000 | ORAL_TABLET | Freq: Two times a day (BID) | ORAL | 0 refills | Status: AC

## 2022-05-29 NOTE — Progress Notes (Signed)
   Subjective:    Patient ID: Sheila [name], female    DOB: 03/25/1981, 42 y.o.   MRN: [id number]  HPI Here for 2 days of body aches, headache, ST, and stuffy head. No fever or cough or SOB. Her husband has the same symptoms., and he tested positive for Covid.    Review of Systems  Constitutional: Negative.   HENT:  Positive for congestion and sore throat. Negative for ear pain, postnasal drip and sinus pressure.   Eyes: Negative.   Respiratory: Negative.    Cardiovascular: Negative.   Gastrointestinal: Negative.   Musculoskeletal:  Positive for myalgias.  Neurological:  Positive for headaches.       Objective:   Physical Exam Constitutional:      Appearance: Normal appearance. She is not ill-appearing.  HENT:     Right Ear: Tympanic membrane, ear canal and external ear normal.     Left Ear: Tympanic membrane, ear canal and external ear normal.     Nose: Nose normal.     Mouth/Throat:     Pharynx: Oropharynx is clear.  Eyes:     Conjunctiva/sclera: Conjunctivae normal.  Pulmonary:     Effort: Pulmonary effort is normal.     Breath sounds: Normal breath sounds.  Lymphadenopathy:     Cervical: No cervical adenopathy.  Neurological:     Mental Status: She is alert.           Assessment & Plan:  Viral illness, likely due to a Covid infection. We will treat with 5 days of Paxlovid.  Alysia Penna, MD

## 2022-06-14 ENCOUNTER — Other Ambulatory Visit: Payer: Self-pay | Admitting: Family Medicine

## 2022-06-15 NOTE — Telephone Encounter (Signed)
Pt LOV was on 05/29/22 Last refill was done on 05/13/22 Please advise

## 2022-06-28 ENCOUNTER — Other Ambulatory Visit: Payer: Self-pay | Admitting: Family Medicine

## 2022-07-24 ENCOUNTER — Other Ambulatory Visit: Payer: Self-pay | Admitting: Family Medicine

## 2022-07-27 NOTE — Telephone Encounter (Signed)
Last refill-01/26/22-60 tabs, 5 refills Last OV-05/29/2022  No future OV scheduled.

## 2022-08-23 ENCOUNTER — Other Ambulatory Visit: Payer: Self-pay | Admitting: Family Medicine

## 2022-08-23 ENCOUNTER — Encounter: Payer: Self-pay | Admitting: Family Medicine

## 2022-08-24 ENCOUNTER — Other Ambulatory Visit: Payer: Self-pay | Admitting: Family Medicine

## 2022-08-25 ENCOUNTER — Other Ambulatory Visit: Payer: Self-pay | Admitting: Family Medicine

## 2022-08-25 MED ORDER — AMPHETAMINE-DEXTROAMPHETAMINE 20 MG PO TABS: 20.0000 mg | ORAL_TABLET | Freq: Every day | ORAL | 0 refills | Status: DC

## 2022-08-25 MED ORDER — AMPHETAMINE-DEXTROAMPHET ER 20 MG PO CP24: 20.0000 mg | ORAL_CAPSULE | ORAL | 0 refills | Status: DC

## 2022-08-25 NOTE — Telephone Encounter (Signed)
Pt LOV was on 05/29/2022 Last refill was done on 07/19/22 for both adderall strength Please advise

## 2022-08-25 NOTE — Telephone Encounter (Signed)
Message sent to Dr Fry for approval 

## 2022-08-26 ENCOUNTER — Other Ambulatory Visit: Payer: Self-pay

## 2022-09-01 ENCOUNTER — Other Ambulatory Visit: Payer: Self-pay | Admitting: Family Medicine

## 2022-09-03 ENCOUNTER — Other Ambulatory Visit: Payer: Self-pay

## 2022-09-03 ENCOUNTER — Encounter: Payer: Self-pay | Admitting: Family Medicine

## 2022-09-03 MED ORDER — TOPIRAMATE 50 MG PO TABS: ORAL_TABLET | ORAL | 0 refills | Status: DC

## 2022-09-04 ENCOUNTER — Other Ambulatory Visit: Payer: Self-pay

## 2022-09-24 ENCOUNTER — Telehealth: Payer: Self-pay | Admitting: Family Medicine

## 2022-09-24 NOTE — Telephone Encounter (Signed)
Prescription Request  09/24/2022  LOV: 05/29/2022  What is the name of the medication or equipment?   amphetamine-dextroamphetamine (ADDERALL XR) 20 MG 24 hr capsule  amphetamine-dextroamphetamine (ADDERALL) 20 MG tablet  Pt is asking for a 90 day supply of each. Pt informed MD is OOO until 09/28/22.   Have you contacted your pharmacy to request a refill? No   Which pharmacy would you like this sent to?   [id number] [place], [place] - [address] AT [address] OF [facility] [address] Phone: [phone]  Fax: [phone]     Patient notified that their request is being sent to the clinical staff for review and that they should receive a response within 2 business days.   Please advise at Mobile [phone] (mobile)

## 2022-09-25 NOTE — Telephone Encounter (Signed)
Pt LOV was on 05/29/22 Last refill was done on 08/25/22 for 30 tablets Please advise

## 2022-09-29 MED ORDER — AMPHETAMINE-DEXTROAMPHET ER 20 MG PO CP24: 20.0000 mg | ORAL_CAPSULE | ORAL | 0 refills | Status: DC

## 2022-09-29 MED ORDER — AMPHETAMINE-DEXTROAMPHETAMINE 20 MG PO TABS: 20.0000 mg | ORAL_TABLET | Freq: Every day | ORAL | 0 refills | Status: DC

## 2022-09-29 NOTE — Telephone Encounter (Signed)
Done

## 2022-10-12 ENCOUNTER — Encounter: Payer: Self-pay | Admitting: Family Medicine

## 2022-10-13 ENCOUNTER — Other Ambulatory Visit: Payer: Self-pay | Admitting: Family Medicine

## 2022-10-13 MED ORDER — TOPIRAMATE 50 MG PO TABS: ORAL_TABLET | ORAL | 0 refills | Status: DC

## 2022-10-14 NOTE — Telephone Encounter (Signed)
She has refills for these available at her pharmacy.  She can try using [facility] to buy them

## 2022-10-26 DIAGNOSIS — Z419 Encounter for procedure for purposes other than remedying health state, unspecified: Secondary | ICD-10-CM | POA: Diagnosis not present

## 2022-11-05 ENCOUNTER — Other Ambulatory Visit: Payer: Self-pay | Admitting: Family Medicine

## 2022-11-26 DIAGNOSIS — Z419 Encounter for procedure for purposes other than remedying health state, unspecified: Secondary | ICD-10-CM | POA: Diagnosis not present

## 2022-12-10 ENCOUNTER — Other Ambulatory Visit: Payer: Self-pay | Admitting: Family Medicine

## 2022-12-10 MED ORDER — AMPHETAMINE-DEXTROAMPHETAMINE 20 MG PO TABS: 20.0000 mg | ORAL_TABLET | Freq: Every day | ORAL | 0 refills | Status: DC

## 2022-12-10 MED ORDER — AMPHETAMINE-DEXTROAMPHET ER 20 MG PO CP24: 20.0000 mg | ORAL_CAPSULE | ORAL | 0 refills | Status: DC

## 2022-12-10 NOTE — Telephone Encounter (Signed)
Pt LOV was on 05/29/22 Last refill was done on 06/15/2022 Please advise

## 2022-12-10 NOTE — Telephone Encounter (Signed)
Pt LOV was on 05/29/22 Last refill was done on 06/15/22 Please advise

## 2022-12-10 NOTE — Telephone Encounter (Signed)
Done

## 2022-12-27 DIAGNOSIS — Z419 Encounter for procedure for purposes other than remedying health state, unspecified: Secondary | ICD-10-CM | POA: Diagnosis not present

## 2023-01-20 ENCOUNTER — Other Ambulatory Visit: Payer: Self-pay | Admitting: Family Medicine

## 2023-01-21 NOTE — Telephone Encounter (Signed)
Pt LOV was on 05/29/22 Last refill was done on 07/28/22 Please advise

## 2023-01-26 DIAGNOSIS — Z419 Encounter for procedure for purposes other than remedying health state, unspecified: Secondary | ICD-10-CM | POA: Diagnosis not present

## 2023-02-08 ENCOUNTER — Telehealth: Payer: Self-pay | Admitting: Family Medicine

## 2023-02-26 DIAGNOSIS — Z419 Encounter for procedure for purposes other than remedying health state, unspecified: Secondary | ICD-10-CM | POA: Diagnosis not present

## 2023-03-28 DIAGNOSIS — Z419 Encounter for procedure for purposes other than remedying health state, unspecified: Secondary | ICD-10-CM | POA: Diagnosis not present

## 2023-04-12 ENCOUNTER — Other Ambulatory Visit: Payer: Self-pay | Admitting: Family Medicine

## 2023-04-15 MED ORDER — AMPHETAMINE-DEXTROAMPHETAMINE 20 MG PO TABS: 20.0000 mg | ORAL_TABLET | Freq: Every day | ORAL | 0 refills | Status: DC

## 2023-04-15 MED ORDER — AMPHETAMINE-DEXTROAMPHET ER 20 MG PO CP24: 20.0000 mg | ORAL_CAPSULE | ORAL | 0 refills | Status: DC

## 2023-04-15 NOTE — Telephone Encounter (Signed)
Error

## 2023-04-15 NOTE — Telephone Encounter (Signed)
Done

## 2023-04-15 NOTE — Telephone Encounter (Signed)
Pt LOV was 05/29/22 Last refill done on 12/30/22 Please advise

## 2023-04-28 DIAGNOSIS — Z419 Encounter for procedure for purposes other than remedying health state, unspecified: Secondary | ICD-10-CM | POA: Diagnosis not present

## 2023-04-30 DIAGNOSIS — Z1231 Encounter for screening mammogram for malignant neoplasm of breast: Secondary | ICD-10-CM | POA: Diagnosis not present

## 2023-04-30 DIAGNOSIS — Z124 Encounter for screening for malignant neoplasm of cervix: Secondary | ICD-10-CM | POA: Diagnosis not present

## 2023-04-30 DIAGNOSIS — Z0000 Encounter for general adult medical examination without abnormal findings: Secondary | ICD-10-CM | POA: Diagnosis not present

## 2023-04-30 LAB — HM MAMMOGRAPHY

## 2023-05-04 LAB — HM PAP SMEAR
HM Pap smear: NEGATIVE
HPV, high-risk: NEGATIVE

## 2023-05-08 ENCOUNTER — Other Ambulatory Visit: Payer: Self-pay | Admitting: Family Medicine

## 2023-05-29 DIAGNOSIS — Z419 Encounter for procedure for purposes other than remedying health state, unspecified: Secondary | ICD-10-CM | POA: Diagnosis not present

## 2023-06-16 ENCOUNTER — Other Ambulatory Visit: Payer: Self-pay | Admitting: Family Medicine

## 2023-06-26 DIAGNOSIS — Z419 Encounter for procedure for purposes other than remedying health state, unspecified: Secondary | ICD-10-CM | POA: Diagnosis not present

## 2023-07-24 ENCOUNTER — Other Ambulatory Visit: Payer: Self-pay | Admitting: Family Medicine

## 2023-07-25 ENCOUNTER — Other Ambulatory Visit: Payer: Self-pay | Admitting: Family Medicine

## 2023-07-26 NOTE — Telephone Encounter (Signed)
 Pt LOV was 05/29/22 Last refill done on 01/22/23 Please advise

## 2023-08-07 DIAGNOSIS — Z419 Encounter for procedure for purposes other than remedying health state, unspecified: Secondary | ICD-10-CM | POA: Diagnosis not present

## 2023-08-10 ENCOUNTER — Other Ambulatory Visit: Payer: Self-pay | Admitting: Family Medicine

## 2023-08-12 ENCOUNTER — Other Ambulatory Visit: Payer: Self-pay | Admitting: Family Medicine

## 2023-08-12 NOTE — Telephone Encounter (Signed)
 Patient has a appt scheduled now and is needing her medication refill before her appt

## 2023-08-16 ENCOUNTER — Other Ambulatory Visit: Payer: Self-pay | Admitting: Family Medicine

## 2023-08-16 NOTE — Telephone Encounter (Signed)
 Attempt to send just Topamax . Has trouble sending it.   Follow up with pt and inform her, she can have it fills at her appt time. Pt is aware.

## 2023-08-16 NOTE — Telephone Encounter (Signed)
 Contacted pt to follow up.   Pt reports she schedule an appt with Dr. Alyne Babinski for tomorrow from last week and someone suppose to send in her RX in but did not.   Inform pt her Topamax  can be send in today but Adderall, provider has to approve it. Pt is aware. Advise pt to keep her appt tomorrow. Pt verbalized understanding.

## 2023-08-16 NOTE — Telephone Encounter (Signed)
 Copied from CRM [id number]. Topic: Clinical - Medication Refill >> Aug 16, 2023  9:04 AM Marlan Silva wrote: Most Recent Primary Care Visit:  Provider: Corita Diego A  Department: [facility]  Visit Type: OFFICE VISIT  Date: 05/29/2022  Medication: topiramate  (TOPAMAX ) 50 MG tablet  Has the patient contacted their pharmacy? Yes (Agent: If no, request that the patient contact the pharmacy for the refill. If patient does not wish to contact the pharmacy document the reason why and proceed with request.) (Agent: If yes, when and what did the pharmacy advise?)  Is this the correct pharmacy for this prescription? Yes If no, delete pharmacy and type the correct one.  This is the patient's preferred pharmacy:  [id number] [place], [place] - [address] AT CORNER OF [facility] [address] [place] [address] Phone: [phone] Fax: [phone]  [id number] [place], [place] - [address] AT [facility] OF [facility] [address] [facility] [address] [place] [address] Phone: [phone] Fax: [phone]   Has the prescription been filled recently? Yes  Is the patient out of the medication? Yes  Has the patient been seen for an appointment in the last year OR does the patient have an upcoming appointment? Yes  Can we respond through MyChart? Yes  Agent: Please be advised that Rx refills may take up to 3 business days. We ask that you follow-up with your pharmacy.

## 2023-08-17 ENCOUNTER — Other Ambulatory Visit: Payer: Self-pay

## 2023-08-17 ENCOUNTER — Encounter: Payer: Self-pay | Admitting: Family Medicine

## 2023-08-17 ENCOUNTER — Ambulatory Visit (INDEPENDENT_AMBULATORY_CARE_PROVIDER_SITE_OTHER): Admitting: Family Medicine

## 2023-08-17 VITALS — BP 102/74 | HR 71 | Temp 98.4°F | Wt 177.2 lb

## 2023-08-17 DIAGNOSIS — G43901 Migraine, unspecified, not intractable, with status migrainosus: Secondary | ICD-10-CM | POA: Diagnosis not present

## 2023-08-17 DIAGNOSIS — F901 Attention-deficit hyperactivity disorder, predominantly hyperactive type: Secondary | ICD-10-CM

## 2023-08-17 MED ORDER — SUMATRIPTAN SUCCINATE 100 MG PO TABS: ORAL_TABLET | ORAL | 3 refills | Status: DC

## 2023-08-17 MED ORDER — AMPHETAMINE-DEXTROAMPHET ER 20 MG PO CP24: 20.0000 mg | ORAL_CAPSULE | ORAL | 0 refills | Status: DC

## 2023-08-17 MED ORDER — AMPHETAMINE-DEXTROAMPHETAMINE 20 MG PO TABS: 20.0000 mg | ORAL_TABLET | Freq: Every day | ORAL | 0 refills | Status: DC

## 2023-08-17 MED ORDER — TOPIRAMATE 50 MG PO TABS: 150.0000 mg | ORAL_TABLET | Freq: Every day | ORAL | 0 refills | Status: DC

## 2023-08-17 NOTE — Telephone Encounter (Signed)
 Done

## 2023-08-17 NOTE — Progress Notes (Signed)
   Subjective:    Patient ID: De Evens, female    DOB: 08-Nov-1980, 43 y.o.   MRN: [id number]  HPI Here to follow up on ADHD and migraines. She has been doing well and her BP at home is stable.    Review of Systems  Constitutional: Negative.   Respiratory: Negative.    Cardiovascular: Negative.   Neurological:  Positive for headaches.       Objective:   Physical Exam Constitutional:      Appearance: Normal appearance.  Cardiovascular:     Rate and Rhythm: Normal rate and regular rhythm.     Pulses: Normal pulses.     Heart sounds: Normal heart sounds.  Pulmonary:     Effort: Pulmonary effort is normal.     Breath sounds: Normal breath sounds.  Neurological:     General: No focal deficit present.     Mental Status: She is alert and oriented to person, place, and time.           Assessment & Plan:  Her ADHD and migraines are stable. Her Adderall and Topamax  were refilled.  Corita Diego, MD

## 2023-09-06 DIAGNOSIS — Z419 Encounter for procedure for purposes other than remedying health state, unspecified: Secondary | ICD-10-CM | POA: Diagnosis not present

## 2023-09-08 ENCOUNTER — Other Ambulatory Visit: Payer: Self-pay | Admitting: Family Medicine

## 2023-10-07 DIAGNOSIS — Z419 Encounter for procedure for purposes other than remedying health state, unspecified: Secondary | ICD-10-CM | POA: Diagnosis not present

## 2023-11-02 ENCOUNTER — Other Ambulatory Visit: Payer: Self-pay | Admitting: Family Medicine

## 2023-11-02 MED ORDER — AMPHETAMINE-DEXTROAMPHETAMINE 20 MG PO TABS: 20.0000 mg | ORAL_TABLET | Freq: Every day | ORAL | 0 refills | Status: DC

## 2023-11-02 MED ORDER — AMPHETAMINE-DEXTROAMPHET ER 20 MG PO CP24: 20.0000 mg | ORAL_CAPSULE | ORAL | 0 refills | Status: DC

## 2023-11-02 NOTE — Telephone Encounter (Signed)
 Copied from CRM [id number]. Topic: Clinical - Medication Refill >> Nov 02, 2023  8:26 AM Deaijah H wrote: Medication: amphetamine -dextroamphetamine  (ADDERALL XR) 20 MG 24 hr capsule/amphetamine -dextroamphetamine  (ADDERALL) 20 MG tablet  Has the patient contacted their pharmacy? Yes (Agent: If no, request that the patient contact the pharmacy for the refill. If patient does not wish to contact the pharmacy document the reason why and proceed with request.) (Agent: If yes, when and what did the pharmacy advise?) - Advised to contact provider  This is the patient's preferred pharmacy:  [id number] [place], [place] - [address] AT CORNER OF [facility] [address] [place] [address] Phone: [phone] Fax: [phone]   Is this the correct pharmacy for this prescription? Yes If no, delete pharmacy and type the correct one.   Has the prescription been filled recently? Yes  Is the patient out of the medication? Yes  Has the patient been seen for an appointment in the last year OR does the patient have an upcoming appointment? Yes  Can we respond through MyChart? Yes  Agent: Please be advised that Rx refills may take up to 3 business days. We ask that you follow-up with your pharmacy.

## 2023-11-02 NOTE — Telephone Encounter (Signed)
 Done

## 2023-11-06 DIAGNOSIS — Z419 Encounter for procedure for purposes other than remedying health state, unspecified: Secondary | ICD-10-CM | POA: Diagnosis not present

## 2023-12-07 DIAGNOSIS — Z419 Encounter for procedure for purposes other than remedying health state, unspecified: Secondary | ICD-10-CM | POA: Diagnosis not present

## 2023-12-09 ENCOUNTER — Other Ambulatory Visit: Payer: Self-pay | Admitting: Family Medicine

## 2023-12-23 ENCOUNTER — Other Ambulatory Visit: Payer: Self-pay | Admitting: Family Medicine

## 2023-12-24 NOTE — Telephone Encounter (Signed)
 Name of Medication: temazepam  (Restoril ) 30mg  Name of [facility]acy[id number] [place], [place] - [address] AT [facility] OF [facility] [address]  Last Fill or Written Date and Quantity: 06/16/23 30cap 5refills Last Office Visit and Type: 08/17/23 ADHD Next Office Visit and Type:  Last Controlled Substance Agreement Date: 05/08/2013 Last UDS: none

## 2024-01-07 DIAGNOSIS — Z419 Encounter for procedure for purposes other than remedying health state, unspecified: Secondary | ICD-10-CM | POA: Diagnosis not present

## 2024-01-21 ENCOUNTER — Other Ambulatory Visit: Payer: Self-pay | Admitting: Family Medicine

## 2024-02-19 ENCOUNTER — Other Ambulatory Visit: Payer: Self-pay | Admitting: Family Medicine

## 2024-03-27 ENCOUNTER — Telehealth: Payer: Self-pay | Admitting: Family Medicine

## 2024-03-27 NOTE — Telephone Encounter (Unsigned)
 Copied from CRM #[id number]. Topic: Clinical - Medication Refill >> Mar 27, 2024  3:08 PM Sasha M wrote: Medication: amphetamine -dextroamphetamine  (ADDERALL) 20 MG tablet  Has the patient contacted their pharmacy? Yes (Agent: If no, request that the patient contact the pharmacy for the refill. If patient does not wish to contact the pharmacy document the reason why and proceed with request.) (Agent: If yes, when and what did the pharmacy advise?) Call provider  This is the patient's preferred pharmacy:  [id number] - [place], [place] - [address] AT CORNER OF [facility] [address]  [place] [address] Phone: [phone] Fax: [phone]  Is this the correct pharmacy for this prescription? Yes If no, delete pharmacy and type the correct one.   Has the prescription been filled recently? No  Is the patient out of the medication? Yes  Has the patient been seen for an appointment in the last year OR does the patient have an upcoming appointment? Yes  Can we respond through MyChart? Yes  Agent: Please be advised that Rx refills may take up to 3 business days. We ask that you follow-up with your pharmacy.

## 2024-03-28 MED ORDER — AMPHETAMINE-DEXTROAMPHETAMINE 20 MG PO TABS: 20.0000 mg | ORAL_TABLET | Freq: Every day | ORAL | 0 refills | Status: AC

## 2024-03-28 MED ORDER — AMPHETAMINE-DEXTROAMPHET ER 20 MG PO CP24: 20.0000 mg | ORAL_CAPSULE | ORAL | 0 refills | Status: AC

## 2024-03-28 NOTE — Telephone Encounter (Signed)
 Done

## 2024-04-07 DIAGNOSIS — Z419 Encounter for procedure for purposes other than remedying health state, unspecified: Secondary | ICD-10-CM | POA: Diagnosis not present
